# Patient Record
Sex: Male | Born: 1970
Health system: Southern US, Community
[De-identification: ages and names within clinical notes are randomized; demographics above are authoritative.]

## PROBLEM LIST (undated history)

## (undated) DIAGNOSIS — K219 Gastro-esophageal reflux disease without esophagitis: Secondary | ICD-10-CM

## (undated) DIAGNOSIS — S86819A Strain of other muscle(s) and tendon(s) at lower leg level, unspecified leg, initial encounter: Secondary | ICD-10-CM

## (undated) DIAGNOSIS — I10 Essential (primary) hypertension: Secondary | ICD-10-CM

## (undated) DIAGNOSIS — R011 Cardiac murmur, unspecified: Secondary | ICD-10-CM

## (undated) HISTORY — DX: Gastro-esophageal reflux disease without esophagitis: K21.9

## (undated) HISTORY — DX: Cardiac murmur, unspecified: R01.1

## (undated) HISTORY — PX: WISDOM TOOTH EXTRACTION: SHX21

## (undated) HISTORY — PX: KNEE SURGERY: SHX244

---

## 2012-11-04 ENCOUNTER — Emergency Department (INDEPENDENT_AMBULATORY_CARE_PROVIDER_SITE_OTHER)
Admission: EM | Admit: 2012-11-04 | Discharge: 2012-11-04 | Disposition: A | Discharge status: Home / Self Care | Payer: PRIVATE HEALTH INSURANCE | Source: Home / Self Care

## 2012-11-04 ENCOUNTER — Ambulatory Visit (INDEPENDENT_AMBULATORY_CARE_PROVIDER_SITE_OTHER): Payer: PRIVATE HEALTH INSURANCE | Admitting: Sports Medicine

## 2012-11-04 ENCOUNTER — Emergency Department (INDEPENDENT_AMBULATORY_CARE_PROVIDER_SITE_OTHER): Payer: PRIVATE HEALTH INSURANCE

## 2012-11-04 ENCOUNTER — Encounter: Payer: Self-pay | Admitting: *Deleted

## 2012-11-04 DIAGNOSIS — M5137 Other intervertebral disc degeneration, lumbosacral region: Secondary | ICD-10-CM

## 2012-11-04 DIAGNOSIS — M5416 Radiculopathy, lumbar region: Secondary | ICD-10-CM | POA: Insufficient documentation

## 2012-11-04 DIAGNOSIS — M549 Dorsalgia, unspecified: Secondary | ICD-10-CM

## 2012-11-04 DIAGNOSIS — M25559 Pain in unspecified hip: Secondary | ICD-10-CM

## 2012-11-04 DIAGNOSIS — IMO0002 Reserved for concepts with insufficient information to code with codable children: Secondary | ICD-10-CM

## 2012-11-04 HISTORY — DX: Essential (primary) hypertension: I10

## 2012-11-04 MED ORDER — CYCLOBENZAPRINE HCL 10 MG PO TABS: ORAL_TABLET | ORAL | Status: DC

## 2012-11-04 MED ORDER — PREDNISONE 50 MG PO TABS: ORAL_TABLET | ORAL | Status: DC

## 2012-11-04 MED ORDER — KETOROLAC TROMETHAMINE 60 MG/2ML IM SOLN
60.0000 mg | Freq: Once | INTRAMUSCULAR | Status: AC
  Administered 2012-11-04: 60 mg via INTRAMUSCULAR

## 2012-11-04 MED ORDER — MELOXICAM 15 MG PO TABS: ORAL_TABLET | ORAL | Status: DC

## 2012-11-04 NOTE — Progress Notes (Signed)
SPORTS MEDICINE CONSULTATION REPORT  Subjective:    I'm seeing this patient as a consultation for:  Dr. Alvester Morin  CC: Back and leg pain  HPI: This is a very pleasant 41 year old male who unfortunately, has developed pain he localizes in his left buttock traveling down the lateral aspect of his left thigh, left lower leg but not into his foot. He describes the sensation as painful, and like he has a charley horse. He denies burning, numbness, or tingling. The pain is worse with flexion of the spine, worse with long drives in his truck, and worse with coughing, and sneezing. He denies any bowel or bladder incontinence or numbness in the saddle region. He has not yet tried anything to treat this, but he does note that it has been on and off in the past for over 2 years. He does recall increasing his weight, in the gym with a significant amount of squats, and leg presses.  Past medical history, Surgical history, Family history, Social history, Allergies, and medications have been entered into the medical record, reviewed, and no changes needed.   Review of Systems: No headache, visual changes, nausea, vomiting, diarrhea, constipation, dizziness, abdominal pain, skin rash, fevers, chills, night sweats, weight loss, swollen lymph nodes, body aches, joint swelling, muscle aches, chest pain, or shortness of breath.   Objective:   Vitals:  Afebrile, vital signs stable. General: Well Developed, well nourished, and in no acute distress.  Neuro/Psych: Alert and oriented x3, extra-ocular muscles intact, able to move all 4 extremities.  Skin: Warm and dry, no rashes noted.  Respiratory: Not using accessory muscles, speaking in full sentences, trachea midline.  Cardiovascular: Pulses palpable, no extremity edema. Abdomen: Does not appear distended. Back Exam:  Inspection: Unremarkable  Motion: Flexion 45 deg, Extension 45 deg, Side Bending to 45 deg bilaterally,  Rotation to 45 deg bilaterally  SLR laying:  Positive with reproduction of radicular symptoms in an L5 distribution on the left ear XSLR laying: Reproduces pain in the back.  Palpable tenderness: None. FABER: negative. Sensory change: Gross sensation intact to all lumbar and sacral dermatomes.  Reflexes: 2+ at both patellar tendons, 2+ at achilles tendons, Babinski's downgoing.  Strength at left foot  Plantar-flexion: 5/5 Dorsi-flexion: 4/5 Eversion: 5/5 Inversion: 5/5  Leg strength  Quad: 5/5 Hamstring: 5/5 Hip flexor: 5/5 Hip abductors: 5/5  Gait unremarkable.  X-rays show mild degenerative disc disease at the L4-5, and L5-S1 levels.  Impression and Recommendations:   This case required medical decision making of moderate complexity.

## 2012-11-04 NOTE — ED Notes (Signed)
Pt c/o LT hip pain x 1 day, after lifting weights. He took IBF 800mg  today @ 0500.

## 2012-11-04 NOTE — Assessment & Plan Note (Addendum)
Seem to be been present on and off for 2 years. He does have some weakness to dorsiflexion of his left foot. Prednisone, Flexeril at bedtime, Mobic, formal physical therapy. Toradol intramuscular in the urgent care. Return to clinic in 4 weeks to see me.

## 2012-11-05 ENCOUNTER — Telehealth: Payer: Self-pay | Admitting: *Deleted

## 2012-11-05 MED ORDER — TRAMADOL HCL 50 MG PO TABS: 50.0000 mg | ORAL_TABLET | Freq: Three times a day (TID) | ORAL | Status: DC | PRN

## 2012-11-05 NOTE — Telephone Encounter (Signed)
Wife called stating that pt's pain is no better and would like to know what to do. Per Dr. Benjamin Stain we will send Tramadol to pt's pharmacy.

## 2012-12-04 ENCOUNTER — Encounter: Payer: Self-pay | Admitting: Sports Medicine

## 2012-12-04 ENCOUNTER — Ambulatory Visit (INDEPENDENT_AMBULATORY_CARE_PROVIDER_SITE_OTHER): Payer: PRIVATE HEALTH INSURANCE | Admitting: Sports Medicine

## 2012-12-04 VITALS — BP 157/100 | HR 61 | Wt 242.0 lb

## 2012-12-04 DIAGNOSIS — F17200 Nicotine dependence, unspecified, uncomplicated: Secondary | ICD-10-CM

## 2012-12-04 DIAGNOSIS — IMO0002 Reserved for concepts with insufficient information to code with codable children: Secondary | ICD-10-CM

## 2012-12-04 DIAGNOSIS — I10 Essential (primary) hypertension: Secondary | ICD-10-CM | POA: Insufficient documentation

## 2012-12-04 DIAGNOSIS — M5416 Radiculopathy, lumbar region: Secondary | ICD-10-CM

## 2012-12-04 NOTE — Progress Notes (Signed)
SPORTS MEDICINE CONSULTATION REPORT  Subjective:    CC: Lumbar radiculitis  HPI: This is a very pleasant 41 year old male who comes back for followup of low back pain radiating down his leg. To recap, approximately 2 weeks ago he recalls an episode in the gym while pushing on the leg press. He felt a pop, and immediate pain in his low back radiating down his left leg in an L5 distribution. I saw him in the urgent care Center, we gave him Toradol intramuscular, prednisone, muscle relaxers, and anti-inflammatories. X-rays at that time showed L5-S1, as well as L4-L5 mild degenerative disc disease. Since then he is almost completely better. He has been working extensively on the home rehabilitation program, and has noted increase in strength of his core muscles. At this point he continues to improve I would like to avoid advanced imaging until he plateaus.  Smoker: Smoking for a long time. Would like to quit, recently found out his wife is pregnant, and is trying to turn over a new stone.  Hypertension: Is on lisinopril 20 mg daily, but has noted that his blood pressure continues to be high. This has been present for a long time. He also desires to transfer care to [place].  Past medical history, Surgical history, Family history, Social history, Allergies, and medications have been entered into the medical record, reviewed, and no changes needed.   Review of Systems: No headache, visual changes, nausea, vomiting, diarrhea, constipation, dizziness, abdominal pain, skin rash, fevers, chills, night sweats, weight loss, swollen lymph nodes, body aches, joint swelling, muscle aches, chest pain, shortness of breath, mood changes, visual or auditory hallucinations.   Objective:   Vitals:  Afebrile, vital signs stable. General: Well Developed, well nourished, and in no acute distress.  Neuro/Psych: Alert and oriented x3, extra-ocular muscles intact, able to move all 4 extremities.  Skin: Warm and dry, no rashes  noted.  Respiratory: Not using accessory muscles, speaking in full sentences, trachea midline.  Cardiovascular: Pulses palpable, no extremity edema. Abdomen: Does not appear distended. Back Exam:  Inspection: Unremarkable  Motion: Flexion 45 deg, Extension 45 deg, Side Bending to 45 deg bilaterally,  Rotation to 45 deg bilaterally  SLR laying: Negative  XSLR laying: Negative  Palpable tenderness: None. FABER: negative. Sensory change: Gross sensation intact to all lumbar and sacral dermatomes.  Reflexes: 2+ at both patellar tendons, 2+ at achilles tendons, Babinski's downgoing.  Strength at foot  Plantar-flexion: 5/5 Dorsi-flexion: 5/5 Eversion: 5/5 Inversion: 5/5  Leg strength  Quad: 5/5 Hamstring: 5/5 Hip flexor: 5/5 Hip abductors: 5/5  Gait unremarkable.  Impression and Recommendations:   This case required medical decision making of moderate complexity.

## 2012-12-04 NOTE — Assessment & Plan Note (Signed)
Was completely improved. He will continue his home rehabilitation exercises, and I will see him back in one month. If he plateaus in improvement, will consider MRI for interventional injection planning.

## 2012-12-04 NOTE — Assessment & Plan Note (Signed)
Only on lisinopril. I would like him to avoid all salt. He will come back and see me and we will further augment his medications. He does desire to switch care to [place].

## 2012-12-04 NOTE — Assessment & Plan Note (Signed)
Recently found out that his wife is pregnant. This will be his first child, and he is very motivated to quit smoking now. We will discuss strategies at his next visit, I do suspect we should try Chantix.

## 2013-01-02 ENCOUNTER — Ambulatory Visit (INDEPENDENT_AMBULATORY_CARE_PROVIDER_SITE_OTHER): Payer: PRIVATE HEALTH INSURANCE | Admitting: Sports Medicine

## 2013-01-02 ENCOUNTER — Encounter: Payer: Self-pay | Admitting: Sports Medicine

## 2013-01-02 VITALS — BP 141/87 | HR 75 | Wt 245.0 lb

## 2013-01-02 DIAGNOSIS — IMO0002 Reserved for concepts with insufficient information to code with codable children: Secondary | ICD-10-CM

## 2013-01-02 DIAGNOSIS — I10 Essential (primary) hypertension: Secondary | ICD-10-CM

## 2013-01-02 DIAGNOSIS — M5416 Radiculopathy, lumbar region: Secondary | ICD-10-CM

## 2013-01-02 MED ORDER — LISINOPRIL 40 MG PO TABS: 40.0000 mg | ORAL_TABLET | Freq: Every day | ORAL | Status: DC

## 2013-01-02 NOTE — Assessment & Plan Note (Signed)
Increasing lisinopril to 40mg .

## 2013-01-02 NOTE — Assessment & Plan Note (Signed)
Back pain is completely resolved, he still has some weakness in his leg, predominantly left foot dorsiflexion. We are going to obtain an MRI at this point for interventional injection planning. Hopefully we can get some of his strength back, otherwise he would certainly need microdiscectomy.

## 2013-01-02 NOTE — Progress Notes (Signed)
Subjective:    CC: Followup  HPI: Left L5 radiculitis: 4 weeks later, back pain is completely resolved with rehabilitation, prednisone, anti-inflammatories, and cyclobenzaprine. Unfortunately he still has some weakness in his left leg predominately with knee extension, hip flexion, and ankle dorsiflexion.  Symptoms are moderate, there is no further radiating pain.  Hypertension: Improved significantly with lisinopril 20.  Past medical history, Surgical history, Family history not pertinant except as noted below, Social history, Allergies, and medications have been entered into the medical record, reviewed, and no changes needed.   Review of Systems: No fevers, chills, night sweats, weight loss, chest pain, or shortness of breath.   Objective:    General: Well Developed, well nourished, and in no acute distress.  Neuro: Alert and oriented x3, extra-ocular muscles intact, sensation grossly intact.  HEENT: Normocephalic, atraumatic, pupils equal round reactive to light, neck supple, no masses, no lymphadenopathy, thyroid nonpalpable.  Skin: Warm and dry, no rashes. Cardiac: Regular rate and rhythm, no murmurs rubs or gallops.  Respiratory: Clear to auscultation bilaterally. Not using accessory muscles, speaking in full sentences. Weak ankle dorsiflexion on the left side.  Impression and Recommendations:

## 2013-01-16 ENCOUNTER — Ambulatory Visit (INDEPENDENT_AMBULATORY_CARE_PROVIDER_SITE_OTHER): Payer: PRIVATE HEALTH INSURANCE | Admitting: Sports Medicine

## 2013-01-16 ENCOUNTER — Encounter: Payer: Self-pay | Admitting: Sports Medicine

## 2013-01-16 VITALS — BP 159/97 | HR 74 | Wt 246.0 lb

## 2013-01-16 DIAGNOSIS — L02412 Cutaneous abscess of left axilla: Secondary | ICD-10-CM | POA: Insufficient documentation

## 2013-01-16 DIAGNOSIS — IMO0002 Reserved for concepts with insufficient information to code with codable children: Secondary | ICD-10-CM

## 2013-01-16 MED ORDER — HYDROCODONE-ACETAMINOPHEN 10-325 MG PO TABS: 1.0000 | ORAL_TABLET | Freq: Three times a day (TID) | ORAL | Status: DC | PRN

## 2013-01-16 MED ORDER — DOXYCYCLINE HYCLATE 100 MG PO TABS: 100.0000 mg | ORAL_TABLET | Freq: Two times a day (BID) | ORAL | Status: AC

## 2013-01-16 NOTE — Progress Notes (Signed)
Subjective:    CC: Boil  HPI: Nicholas Pearson is a very pleasant 42 year old male who I have been following for lumbar radiculitis.  Unfortunately he comes in with pain he localizes in the left armpit. He said abscesses before, and this is one of the largest. Pain is localized, doesn't radiate, it is severe.  Past medical history, Surgical history, Family history not pertinant except as noted below, Social history, Allergies, and medications have been entered into the medical record, reviewed, and no changes needed.   Review of Systems: No fevers, chills, night sweats, weight loss, chest pain, or shortness of breath.   Objective:    General: Well Developed, well nourished, and in no acute distress.  Neuro: Alert and oriented x3, extra-ocular muscles intact, sensation grossly intact.  HEENT: Normocephalic, atraumatic, pupils equal round reactive to light, neck supple, no masses, no lymphadenopathy, thyroid nonpalpable.  Skin: Warm and dry, no rashes. There is a large abscess approximately 3-4 cm across in the left axilla. Cardiac: Regular rate and rhythm, no murmurs rubs or gallops.  Respiratory: Clear to auscultation bilaterally. Not using accessory muscles, speaking in full sentences.  Procedure:  Complicated incision and drainage of abscess.  Risks, benefits, and alternatives explained and consent obtained. Time out conducted. Surface cleaned with alcohol. 10cc lidocaine with epinephine infiltrated around abscess. Adequate anesthesia ensured. Area prepped and draped in a sterile fashion. #11 blade used to make a stab incision into abscess. Pus expressed with pressure. Curved hemostat used to explore 4 quadrants and loculations broken up. Further purulence expressed. Iodoform packing placed leaving a 1-inch tail. Hemostasis achieved. Pt stable. Aftercare and follow-up advised. Impression and Recommendations:

## 2013-01-16 NOTE — Patient Instructions (Addendum)
Incision and Drainage Care After Refer to this sheet in the next few weeks. These instructions provide you with information on caring for yourself after your procedure. Your caregiver may also give you more specific instructions. Your treatment has been planned according to current medical practices, but problems sometimes occur. Call your caregiver if you have any problems or questions after your procedure. HOME CARE INSTRUCTIONS    If antibiotic medicine is given, take it as directed. Finish it even if you start to feel better.   Only take over-the-counter or prescription medicines for pain, discomfort, or fever as directed by your caregiver.   Keep all follow-up appointments as directed by your caregiver.   Change any bandages (dressings) as directed by your caregiver. Replace old dressings with clean dressings.   Wash your hands before and after caring for your wound.  You will receive specific instructions for cleansing and caring for your wound.   SEEK MEDICAL CARE IF:    You have increased pain, swelling, or redness around the wound.   You have increased drainage, smell, or bleeding from the wound.   You have muscle aches, chills, or you feel generally sick.   You have a fever.  MAKE SURE YOU:    Understand these instructions.   Will watch your condition.   Will get help right away if you are not doing well or get worse.  Document Released: 02/25/2012 Document Reviewed: 02/25/2012 [employer] Patient Information 2013 [employer].    

## 2013-01-16 NOTE — Assessment & Plan Note (Signed)
Large, complicated drainage with approximately 1 foot of iodoform packing. Doxycycline for 14 days, culture, Vicodin. He will remove half of the packing after a couple of days, and then come to see me in a week for a wound check.

## 2013-01-19 ENCOUNTER — Encounter: Payer: Self-pay | Admitting: Sports Medicine

## 2013-01-19 ENCOUNTER — Telehealth: Payer: Self-pay | Admitting: *Deleted

## 2013-01-19 ENCOUNTER — Ambulatory Visit (INDEPENDENT_AMBULATORY_CARE_PROVIDER_SITE_OTHER): Payer: PRIVATE HEALTH INSURANCE | Admitting: Sports Medicine

## 2013-01-19 VITALS — BP 163/103 | HR 75

## 2013-01-19 DIAGNOSIS — IMO0002 Reserved for concepts with insufficient information to code with codable children: Secondary | ICD-10-CM

## 2013-01-19 DIAGNOSIS — L02412 Cutaneous abscess of left axilla: Secondary | ICD-10-CM

## 2013-01-19 DIAGNOSIS — M5416 Radiculopathy, lumbar region: Secondary | ICD-10-CM

## 2013-01-19 LAB — WOUND CULTURE
Culture: NO GROWTH
Gram Stain: NONE SEEN
Gram Stain: NONE SEEN
Gram Stain: NONE SEEN
Organism ID, Bacteria: NO GROWTH

## 2013-01-19 NOTE — Telephone Encounter (Signed)
Lanced abcess under arm Friday. Pulled some packing out yesterday and today when got up the piece that hanging off ffrom where he cut it the day before is not there and packing is down in the incision and he can not see it. Please advise. Has appt here friday

## 2013-01-19 NOTE — Telephone Encounter (Signed)
Pt on his way now.

## 2013-01-19 NOTE — Assessment & Plan Note (Signed)
Symptoms continue to improve. Still has MRI scheduled. Will follow this up after MRI.

## 2013-01-19 NOTE — Telephone Encounter (Signed)
No worries, have him come see me either today or tomorrow, I can work him in anytime so I can just fish out the piece of packing material.

## 2013-01-19 NOTE — Assessment & Plan Note (Signed)
It seems as though Nicholas Pearson did remove all of the packing material. I explored the wound extensively, and no further packing material was found.  No further purulence was able to be expressed either.

## 2013-01-19 NOTE — Progress Notes (Signed)
  Subjective:    CC: Followup  HPI: Left axillary abscess: I performed an I&D last week, cultures have been negative, he is currently on doxycycline. Recently, he was pulling out some of the packing, and thought that he cut off a one-inch tail. Unfortunately, the next time he looked he was unable to see any packing and was worried that the wound closed in over top of the packing material. Overall pain is improving, and he denies any constitutional symptoms.  Left lumbar radiculitis: Left L5, overall continuing to improve, but does have an MRI coming up. Pain was localized in the back, radiating down the left leg in an L5 distribution, was moderate.  Past medical history, Surgical history, Family history not pertinant except as noted below, Social history, Allergies, and medications have been entered into the medical record, reviewed, and no changes needed.   Review of Systems: No headache, visual changes, nausea, vomiting, diarrhea, constipation, dizziness, abdominal pain, skin rash, fevers, chills, night sweats, weight loss, swollen lymph nodes, body aches, joint swelling, muscle aches, chest pain, shortness of breath, mood changes, visual or auditory hallucinations.   Objective:   General: Well Developed, well nourished, and in no acute distress.  Neuro/Psych: Alert and oriented x3, extra-ocular muscles intact, able to move all 4 extremities, sensation grossly intact. Skin: Warm and dry, no rashes noted.  Respiratory: Not using accessory muscles, speaking in full sentences, trachea midline.  Cardiovascular: Pulses palpable, no extremity edema. Abdomen: Does not appear distended.  Procedure:  Exploration of previous I&D wound.  Risks, benefits, and alternatives explained and consent obtained. Time out conducted. Surface cleaned with alcohol. 10cc lidocaine with epinephine infiltrated around abscess. Adequate anesthesia ensured. Area prepped and draped in a sterile fashion. I used the  hemostat to explore several quadrants of the wound, no further purulence was able to be expressed, and no packing material was left in the wound after extensive exploration. Hemostasis achieved. Pt stable. Aftercare and follow-up advised.  Impression and Recommendations:   This case required medical decision making of moderate complexity.

## 2013-01-21 LAB — ANAEROBIC CULTURE
Gram Stain: NONE SEEN
Gram Stain: NONE SEEN
Gram Stain: NONE SEEN

## 2013-01-23 ENCOUNTER — Ambulatory Visit (INDEPENDENT_AMBULATORY_CARE_PROVIDER_SITE_OTHER): Payer: PRIVATE HEALTH INSURANCE | Admitting: Sports Medicine

## 2013-01-23 ENCOUNTER — Encounter: Payer: Self-pay | Admitting: Sports Medicine

## 2013-01-23 VITALS — BP 152/95 | HR 71 | Temp 97.5°F

## 2013-01-23 DIAGNOSIS — G4733 Obstructive sleep apnea (adult) (pediatric): Secondary | ICD-10-CM | POA: Insufficient documentation

## 2013-01-23 DIAGNOSIS — L02412 Cutaneous abscess of left axilla: Secondary | ICD-10-CM

## 2013-01-23 DIAGNOSIS — G4730 Sleep apnea, unspecified: Secondary | ICD-10-CM

## 2013-01-23 DIAGNOSIS — M5416 Radiculopathy, lumbar region: Secondary | ICD-10-CM

## 2013-01-23 DIAGNOSIS — IMO0002 Reserved for concepts with insufficient information to code with codable children: Secondary | ICD-10-CM

## 2013-01-23 NOTE — Assessment & Plan Note (Signed)
Nicholas Pearson has his MRI coming up. I will see him back to go over MRI results. We will do this for interventional injection planning.

## 2013-01-23 NOTE — Assessment & Plan Note (Signed)
With hypertension, increasing snoring, excessive daytime sleepiness, we will get a sleep study with CPAP titration. If negative, we can certainly consider treating him for acid reflux.

## 2013-01-23 NOTE — Assessment & Plan Note (Signed)
Healing well. Tegaderm applied. Return as needed.

## 2013-01-23 NOTE — Progress Notes (Signed)
Subjective:    CC: Followup  HPI: Left axillary abscess: Status post incision and drainage, overall doing better. He thought that he had lost some of the packing in the wound, I explored the wound extensively at the last visit, found no packing, so we can assume he pulled it all out by accident.  Sleep apnea: As tested in the past but negative, but now notes increasing daytime sleepiness, snoring, and globus sensation. Desiring additional sleep study.  Lumbar radiculitis: Improved, but still not perfect. He is awaiting his MRI which is coming up soon.  Past medical history, Surgical history, Family history not pertinant except as noted below, Social history, Allergies, and medications have been entered into the medical record, reviewed, and no changes needed.   Review of Systems: No fevers, chills, night sweats, weight loss, chest pain, or shortness of breath.   Objective:    General: Well Developed, well nourished, and in no acute distress.  Neuro: Alert and oriented x3, extra-ocular muscles intact, sensation grossly intact.  HEENT: Normocephalic, atraumatic, pupils equal round reactive to light, neck supple, no masses, no lymphadenopathy, thyroid nonpalpable.  Skin: Warm and dry, no rashes. Left axilla shows a healing nodule, it is nontender to palpation without erythema, or induration. Cardiac: Regular rate and rhythm, no murmurs rubs or gallops.  Respiratory: Clear to auscultation bilaterally. Not using accessory muscles, speaking in full sentences. Impression and Recommendations:

## 2013-01-24 ENCOUNTER — Ambulatory Visit (HOSPITAL_BASED_OUTPATIENT_CLINIC_OR_DEPARTMENT_OTHER)
Admission: RE | Admit: 2013-01-24 | Discharge: 2013-01-24 | Disposition: A | Discharge status: Home / Self Care | Payer: PRIVATE HEALTH INSURANCE | Source: Ambulatory Visit | Attending: Sports Medicine | Admitting: Sports Medicine

## 2013-01-24 DIAGNOSIS — M545 Low back pain, unspecified: Secondary | ICD-10-CM | POA: Insufficient documentation

## 2013-01-24 DIAGNOSIS — M79609 Pain in unspecified limb: Secondary | ICD-10-CM | POA: Insufficient documentation

## 2013-01-24 DIAGNOSIS — M5126 Other intervertebral disc displacement, lumbar region: Secondary | ICD-10-CM | POA: Insufficient documentation

## 2013-01-24 DIAGNOSIS — M5416 Radiculopathy, lumbar region: Secondary | ICD-10-CM

## 2013-01-24 DIAGNOSIS — Z87828 Personal history of other (healed) physical injury and trauma: Secondary | ICD-10-CM | POA: Insufficient documentation

## 2013-01-30 ENCOUNTER — Ambulatory Visit: Payer: PRIVATE HEALTH INSURANCE | Admitting: Sports Medicine

## 2013-02-03 ENCOUNTER — Ambulatory Visit (INDEPENDENT_AMBULATORY_CARE_PROVIDER_SITE_OTHER): Payer: PRIVATE HEALTH INSURANCE | Admitting: Sports Medicine

## 2013-02-03 DIAGNOSIS — IMO0002 Reserved for concepts with insufficient information to code with codable children: Secondary | ICD-10-CM

## 2013-02-03 DIAGNOSIS — M5416 Radiculopathy, lumbar region: Secondary | ICD-10-CM

## 2013-02-03 NOTE — Progress Notes (Signed)
  Subjective:    CC: MRI results  HPI: Left lumbar radiculitis: Nicholas Pearson is a very pleasant 42 year old male who's had lumbar radiculitis for some time now. He has been to formal physical therapy, oral steroids, muscle relaxers, NSAIDs, he's noted excellent benefit and is significantly better than when the symptoms started but unfortunately he still gets occasional pain down the posterior lateral aspect of his left thigh, anterior aspect of his left lower leg. He also notes some weakness to dorsiflexion and plantar flexion of his left foot. Symptoms are moderate, stable.  Status post left axillary abscess incision and drainage: He would like me to look at the wound.  Past medical history, Surgical history, Family history not pertinant except as noted below, Social history, Allergies, and medications have been entered into the medical record, reviewed, and no changes needed.   Review of Systems: No headache, visual changes, nausea, vomiting, diarrhea, constipation, dizziness, abdominal pain, skin rash, fevers, chills, night sweats, weight loss, swollen lymph nodes, body aches, joint swelling, muscle aches, chest pain, shortness of breath, mood changes, visual or auditory hallucinations.   Objective:   General: Well Developed, well nourished, and in no acute distress.  Neuro/Psych: Alert and oriented x3, extra-ocular muscles intact, able to move all 4 extremities, sensation grossly intact. Skin: Warm and dry, no rashes noted. Previous incision and drainage site appears well-healed, is no longer any warmth or fluctuance, small nodularities palpable which is not unexpected. Respiratory: Not using accessory muscles, speaking in full sentences, trachea midline.  Cardiovascular: Pulses palpable, no extremity edema. Abdomen: Does not appear distended.   Impression and Recommendations:   This case required medical decision making of moderate complexity.

## 2013-02-03 NOTE — Assessment & Plan Note (Signed)
MRI confirms multilevel degenerative disc disease, worse at the L4-L5 level with bilateral foraminal stenosis as well as at the L5-S1 level with predominantly left-sided foraminal stenosis. He has failed conservative therapy I think the next that the next step should be a left-sided selective L5 nerve root block. I would like to see him back after the injection.

## 2013-02-20 ENCOUNTER — Other Ambulatory Visit: Payer: PRIVATE HEALTH INSURANCE

## 2013-02-27 ENCOUNTER — Other Ambulatory Visit: Payer: PRIVATE HEALTH INSURANCE

## 2013-02-27 ENCOUNTER — Ambulatory Visit
Admission: RE | Admit: 2013-02-27 | Discharge: 2013-02-27 | Disposition: A | Discharge status: Home / Self Care | Payer: No Typology Code available for payment source | Source: Ambulatory Visit | Attending: Sports Medicine | Admitting: Sports Medicine

## 2013-02-27 DIAGNOSIS — M5416 Radiculopathy, lumbar region: Secondary | ICD-10-CM

## 2013-02-27 MED ORDER — METHYLPREDNISOLONE ACETATE 40 MG/ML INJ SUSP (RADIOLOG
120.0000 mg | Freq: Once | INTRAMUSCULAR | Status: AC
  Administered 2013-02-27: 120 mg via EPIDURAL

## 2013-02-27 MED ORDER — IOHEXOL 180 MG/ML  SOLN
1.0000 mL | Freq: Once | INTRAMUSCULAR | Status: AC | PRN
  Administered 2013-02-27: 1 mL via EPIDURAL

## 2013-03-15 ENCOUNTER — Other Ambulatory Visit: Payer: Self-pay | Admitting: Sports Medicine

## 2013-04-08 ENCOUNTER — Telehealth: Payer: Self-pay | Admitting: Sports Medicine

## 2013-04-08 MED ORDER — OMEPRAZOLE 40 MG PO CPDR: 40.0000 mg | DELAYED_RELEASE_CAPSULE | Freq: Every day | ORAL | Status: DC

## 2013-04-08 NOTE — Telephone Encounter (Signed)
Please shoot Nicholas Pearson a call, sleep study was negative for sleep apnea.  I'm going to add a medication for acid reflux.

## 2013-04-08 NOTE — Telephone Encounter (Signed)
He has scheduled an appt b/c he has to f/u with you since he had the injection. I will call him and him your message. Pt informed of your message and states he will see [place] on Friday.

## 2013-04-10 ENCOUNTER — Ambulatory Visit (INDEPENDENT_AMBULATORY_CARE_PROVIDER_SITE_OTHER): Payer: PRIVATE HEALTH INSURANCE | Admitting: Sports Medicine

## 2013-04-10 ENCOUNTER — Encounter: Payer: Self-pay | Admitting: Sports Medicine

## 2013-04-10 VITALS — BP 154/100 | HR 70

## 2013-04-10 DIAGNOSIS — IMO0002 Reserved for concepts with insufficient information to code with codable children: Secondary | ICD-10-CM

## 2013-04-10 DIAGNOSIS — M5416 Radiculopathy, lumbar region: Secondary | ICD-10-CM

## 2013-04-10 DIAGNOSIS — I10 Essential (primary) hypertension: Secondary | ICD-10-CM

## 2013-04-10 DIAGNOSIS — K219 Gastro-esophageal reflux disease without esophagitis: Secondary | ICD-10-CM

## 2013-04-10 DIAGNOSIS — J387 Other diseases of larynx: Secondary | ICD-10-CM

## 2013-04-10 MED ORDER — LISINOPRIL-HYDROCHLOROTHIAZIDE 20-25 MG PO TABS: 1.0000 | ORAL_TABLET | Freq: Every day | ORAL | Status: DC

## 2013-04-10 NOTE — Progress Notes (Signed)
  Subjective:    CC: Followup  HPI: Left lumbar radiculitis: Two-level degenerative disc disease, with bilateral foraminal stenosis at the L5-S1 level, and left-sided foraminal stenosis at the L4-L5 level. He is recently status post left-sided L5-S1 transforaminal injection which only provided minimal benefit, and at no point did he have complete relief of his pain.  Laryngopharyngeal reflux: Is just now starting Prilosec. Sleep study was negative.  Hypertension: Improved but not ideal, no issues with his medication.  Smoker: Is interested in trying a vapor cigarette, does not want to do Chantix.  Past medical history, Surgical history, Family history not pertinant except as noted below, Social history, Allergies, and medications have been entered into the medical record, reviewed, and no changes needed.   Review of Systems: No fevers, chills, night sweats, weight loss, chest pain, or shortness of breath.   Objective:    General: Well Developed, well nourished, and in no acute distress.  Neuro: Alert and oriented x3, extra-ocular muscles intact, sensation grossly intact.  HEENT: Normocephalic, atraumatic, pupils equal round reactive to light, neck supple, no masses, no lymphadenopathy, thyroid nonpalpable.  Skin: Warm and dry, no rashes. Cardiac: Regular rate and rhythm, no murmurs rubs or gallops, no lower extremity edema.  Respiratory: Clear to auscultation bilaterally. Not using accessory muscles, speaking in full sentences. Impression and Recommendations:

## 2013-04-10 NOTE — Assessment & Plan Note (Signed)
With a negative sleep study we will pursue reflux. Prilosec 40 at bedtime.

## 2013-04-10 NOTE — Assessment & Plan Note (Signed)
Increasing to lisinopril/hydrochlorothiazide.

## 2013-04-10 NOTE — Assessment & Plan Note (Signed)
Previous injections at the L5-S1 level level, targeting the L5 nerve root. Improvement was minimal. At this point I think we should try the L4-L5 level with a selective transforaminal injection.

## 2013-04-12 ENCOUNTER — Other Ambulatory Visit: Payer: Self-pay | Admitting: Sports Medicine

## 2013-05-10 ENCOUNTER — Other Ambulatory Visit: Payer: Self-pay | Admitting: Sports Medicine

## 2013-05-25 ENCOUNTER — Ambulatory Visit (INDEPENDENT_AMBULATORY_CARE_PROVIDER_SITE_OTHER): Payer: PRIVATE HEALTH INSURANCE | Admitting: Family Medicine

## 2013-05-25 ENCOUNTER — Encounter: Payer: Self-pay | Admitting: Family Medicine

## 2013-05-25 VITALS — BP 125/74 | HR 78 | Wt 245.0 lb

## 2013-05-25 DIAGNOSIS — K625 Hemorrhage of anus and rectum: Secondary | ICD-10-CM

## 2013-05-25 NOTE — Progress Notes (Signed)
CC: Nicholas Pearson is a 42 y.o. male is here for Blood in stool   Subjective: HPI:  Patient complains of diarrhea that occurred on Saturday 2 episodes of watery diarrhea within 24 hours, second episode had bright red blood in the toilet bowl. Since then he is back to daily bowel movements that are painless without tar-like appearance and without blood. He denies any recent or remote abdominal or pelvic pain. Prior to diarrhea he was having a daily bowel movement at the same time daily which was painless and did not require any straining.  He states he's feeling great today. He denies any back pain, nausea, lack of appetite fevers, chills, nor history of bleeding issues. He denies any rectal discomfort. He believes there is no family history of colon cancer   Review Of Systems Outlined In HPI  Past Medical History  Diagnosis Date  . Hypertension      No family history on file.   History  Substance Use Topics  . Smoking status: Current Some Day Smoker    Types: Cigars  . Smokeless tobacco: Not on file  . Alcohol Use: Yes     Comment: 2 cigars per day.     Objective: Filed Vitals:   05/25/13 1008  BP: 125/74  Pulse: 78    General: Alert and Oriented, No Acute Distress HEENT: Pupils equal, round, reactive to light. Conjunctivae clear.  Lungs: Clear comfortable work of breathing Cardiac: Regular rate and rhythm. Normal S1/S2.  No murmurs, rubs, nor gallops.   Abdomen: Normal bowel sounds, soft and non tender without palpable masses. Rectal: Normal external sphincter tone, scarring present from result external hemorrhoids no evidence of fissure. Extremities: No peripheral edema.  Strong peripheral pulses.  Mental Status: No depression, anxiety, nor agitation. Skin: Warm and dry.  Assessment & Plan: Jerran was seen today for blood in stool.  Diagnoses and associated orders for this visit:  Rectal bleeding    Discussed with patient that most likely he had a internal  hemorrhoid that bled due to his diarrhea episode. Since he believes he has stopped bleeding I would like him to take stool cards home if there is evidence of blood Will refer for colonoscopy/sigmoidoscopy. Given that he is back to painless easy to pass bowel movements on a daily basis we'll hold off on altering fiber intake  Return if symptoms worsen or fail to improve.

## 2013-05-29 ENCOUNTER — Other Ambulatory Visit: Payer: PRIVATE HEALTH INSURANCE

## 2014-03-29 ENCOUNTER — Other Ambulatory Visit: Payer: Self-pay | Admitting: Sports Medicine

## 2014-05-02 ENCOUNTER — Other Ambulatory Visit: Payer: Self-pay | Admitting: Sports Medicine

## 2014-05-04 ENCOUNTER — Telehealth: Payer: Self-pay | Admitting: Sports Medicine

## 2014-05-04 NOTE — Telephone Encounter (Signed)
Which med are we talking about?

## 2014-05-04 NOTE — Telephone Encounter (Signed)
Patien's wife made pt an appointment for 05/14/14 for med refill but he needs a little refill until his appt because he is completely out. Thanks

## 2014-05-07 MED ORDER — LISINOPRIL-HYDROCHLOROTHIAZIDE 20-25 MG PO TABS: 1.0000 | ORAL_TABLET | Freq: Every day | ORAL | Status: DC

## 2014-05-07 NOTE — Telephone Encounter (Signed)
Done

## 2014-05-07 NOTE — Telephone Encounter (Signed)
His blood pressure medicine. 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 Thanks sorry didn't say what med-

## 2014-05-14 ENCOUNTER — Ambulatory Visit: Payer: PRIVATE HEALTH INSURANCE | Admitting: Sports Medicine

## 2014-05-17 ENCOUNTER — Ambulatory Visit (INDEPENDENT_AMBULATORY_CARE_PROVIDER_SITE_OTHER): Payer: BC Managed Care – PPO | Admitting: Sports Medicine

## 2014-05-17 ENCOUNTER — Encounter: Payer: Self-pay | Admitting: Sports Medicine

## 2014-05-17 VITALS — BP 122/78 | HR 69 | Wt 245.0 lb

## 2014-05-17 DIAGNOSIS — Z299 Encounter for prophylactic measures, unspecified: Secondary | ICD-10-CM

## 2014-05-17 DIAGNOSIS — Z0000 Encounter for general adult medical examination without abnormal findings: Secondary | ICD-10-CM | POA: Insufficient documentation

## 2014-05-17 DIAGNOSIS — K625 Hemorrhage of anus and rectum: Secondary | ICD-10-CM

## 2014-05-17 DIAGNOSIS — I10 Essential (primary) hypertension: Secondary | ICD-10-CM

## 2014-05-17 DIAGNOSIS — IMO0002 Reserved for concepts with insufficient information to code with codable children: Secondary | ICD-10-CM | POA: Diagnosis not present

## 2014-05-17 DIAGNOSIS — M5416 Radiculopathy, lumbar region: Secondary | ICD-10-CM

## 2014-05-17 LAB — COMPREHENSIVE METABOLIC PANEL
ALT: 39 U/L (ref 0–53)
AST: 36 U/L (ref 0–37)
Alkaline Phosphatase: 49 U/L (ref 39–117)
Creat: 1.14 mg/dL (ref 0.50–1.35)
Sodium: 137 mEq/L (ref 135–145)
Total Bilirubin: 0.4 mg/dL (ref 0.2–1.2)
Total Protein: 7.5 g/dL (ref 6.0–8.3)

## 2014-05-17 LAB — COMPREHENSIVE METABOLIC PANEL WITH GFR
Albumin: 4.6 g/dL (ref 3.5–5.2)
BUN: 12 mg/dL (ref 6–23)
CO2: 25 meq/L (ref 19–32)
Calcium: 9.7 mg/dL (ref 8.4–10.5)
Chloride: 102 meq/L (ref 96–112)
Glucose, Bld: 97 mg/dL (ref 70–99)
Potassium: 3.8 meq/L (ref 3.5–5.3)

## 2014-05-17 LAB — CBC
HCT: 43.8 % (ref 39.0–52.0)
Hemoglobin: 15.4 g/dL (ref 13.0–17.0)
MCH: 29.8 pg (ref 26.0–34.0)
MCHC: 35.2 g/dL (ref 30.0–36.0)
MCV: 84.9 fL (ref 78.0–100.0)
Platelets: 206 10*3/uL (ref 150–400)
RBC: 5.16 MIL/uL (ref 4.22–5.81)
RDW: 14 % (ref 11.5–15.5)
WBC: 4.3 10*3/uL (ref 4.0–10.5)

## 2014-05-17 LAB — LIPID PANEL
Cholesterol: 207 mg/dL — ABNORMAL HIGH (ref 0–200)
HDL: 40 mg/dL (ref >39)
LDL Cholesterol: 107 mg/dL — ABNORMAL HIGH (ref 0–99)
Total CHOL/HDL Ratio: 5.2 Ratio
Triglycerides: 300 mg/dL — ABNORMAL HIGH (ref <150)
VLDL: 60 mg/dL — ABNORMAL HIGH (ref 0–40)

## 2014-05-17 LAB — HEMOGLOBIN A1C
Hgb A1c MFr Bld: 5.8 % — ABNORMAL HIGH (ref <5.7)
Mean Plasma Glucose: 120 mg/dL — ABNORMAL HIGH (ref <117)

## 2014-05-17 LAB — TSH: TSH: 2.117 u[IU]/mL (ref 0.350–4.500)

## 2014-05-17 MED ORDER — OMEPRAZOLE 40 MG PO CPDR: 40.0000 mg | DELAYED_RELEASE_CAPSULE | Freq: Every day | ORAL | Status: DC

## 2014-05-17 MED ORDER — LISINOPRIL-HYDROCHLOROTHIAZIDE 20-25 MG PO TABS: 1.0000 | ORAL_TABLET | Freq: Every day | ORAL | Status: DC

## 2014-05-17 NOTE — Progress Notes (Signed)
  Subjective:    CC: Followup  HPI: Hypertension: Well controlled.  Acid reflux: Well controlled.  Left L5 radiculopathy : improved after his most recent epidural by Dr. Tessa Lerner.  Rectal bleeding: Resolved.  Past medical history, Surgical history, Family history not pertinant except as noted below, Social history, Allergies, and medications have been entered into the medical record, reviewed, and no changes needed.   Review of Systems: No fevers, chills, night sweats, weight loss, chest pain, or shortness of breath.   Objective:    General: Well Developed, well nourished, and in no acute distress.  Neuro: Alert and oriented x3, extra-ocular muscles intact, sensation grossly intact.  HEENT: Normocephalic, atraumatic, pupils equal round reactive to light, neck supple, no masses, no lymphadenopathy, thyroid nonpalpable.  Skin: Warm and dry, no rashes. Cardiac: Regular rate and rhythm, no murmurs rubs or gallops, no lower extremity edema.  Respiratory: Clear to auscultation bilaterally. Not using accessory muscles, speaking in full sentences.  Impression and Recommendations:

## 2014-05-17 NOTE — Assessment & Plan Note (Signed)
Up-to-date on all preventive measures, did have some rectal bleeding which has stopped. We are checking a CBC to ensure no subclinical blood loss/anemia.

## 2014-05-17 NOTE — Assessment & Plan Note (Signed)
Well-controlled on lisinopril/hydrochlorothiazide. Checking blood work.

## 2014-05-17 NOTE — Assessment & Plan Note (Signed)
Good response to his last lumbar epidural. He will set up another one himself.

## 2014-05-18 ENCOUNTER — Encounter: Payer: Self-pay | Admitting: Sports Medicine

## 2014-05-18 DIAGNOSIS — R7303 Prediabetes: Secondary | ICD-10-CM | POA: Insufficient documentation

## 2014-05-18 DIAGNOSIS — E781 Pure hyperglyceridemia: Secondary | ICD-10-CM | POA: Insufficient documentation

## 2014-11-10 ENCOUNTER — Other Ambulatory Visit: Payer: Self-pay | Admitting: Sports Medicine

## 2014-12-03 ENCOUNTER — Other Ambulatory Visit: Payer: Self-pay | Admitting: *Deleted

## 2014-12-03 MED ORDER — LISINOPRIL-HYDROCHLOROTHIAZIDE 20-25 MG PO TABS: 1.0000 | ORAL_TABLET | Freq: Every day | ORAL | Status: DC

## 2014-12-03 NOTE — Progress Notes (Signed)
[facility] called for refills and they were sent to pharmacy and he was then transferred to front to schedule follow up appt.

## 2014-12-24 ENCOUNTER — Ambulatory Visit: Payer: BC Managed Care – PPO | Admitting: Sports Medicine

## 2014-12-31 ENCOUNTER — Ambulatory Visit: Payer: Self-pay | Admitting: Sports Medicine

## 2014-12-31 DIAGNOSIS — Z0289 Encounter for other administrative examinations: Secondary | ICD-10-CM

## 2015-01-19 ENCOUNTER — Encounter: Payer: Self-pay | Admitting: Sports Medicine

## 2015-01-19 ENCOUNTER — Ambulatory Visit (INDEPENDENT_AMBULATORY_CARE_PROVIDER_SITE_OTHER): Payer: BLUE CROSS/BLUE SHIELD | Admitting: Sports Medicine

## 2015-01-19 VITALS — BP 115/72 | HR 72 | Wt 245.0 lb

## 2015-01-19 DIAGNOSIS — J387 Other diseases of larynx: Secondary | ICD-10-CM | POA: Diagnosis not present

## 2015-01-19 DIAGNOSIS — E781 Pure hyperglyceridemia: Secondary | ICD-10-CM

## 2015-01-19 DIAGNOSIS — K219 Gastro-esophageal reflux disease without esophagitis: Secondary | ICD-10-CM

## 2015-01-19 DIAGNOSIS — I10 Essential (primary) hypertension: Secondary | ICD-10-CM | POA: Diagnosis not present

## 2015-01-19 MED ORDER — NIACIN ER (ANTIHYPERLIPIDEMIC) 1000 MG PO TBCR: 1000.0000 mg | EXTENDED_RELEASE_TABLET | Freq: Every day | ORAL | Status: DC

## 2015-01-19 MED ORDER — LISINOPRIL-HYDROCHLOROTHIAZIDE 20-25 MG PO TABS: 1.0000 | ORAL_TABLET | Freq: Every day | ORAL | Status: DC

## 2015-01-19 NOTE — Assessment & Plan Note (Signed)
Adding niacin. Recheck in 3 months.

## 2015-01-19 NOTE — Assessment & Plan Note (Signed)
Well controlled with omeprazole.

## 2015-01-19 NOTE — Assessment & Plan Note (Signed)
Well controlled, no changes 

## 2015-01-19 NOTE — Progress Notes (Signed)
  Subjective:    CC: follow-up  HPI: Hypertension: Well controlled, needs refill on medication.  Hyperlipidemia: Predominantly hypertriglyceridemia, amenable to start medication.  Laryngeal pharyngeal reflux: Well controlled with omeprazole.  Lumbar degenerative disc disease: Currently getting epidurals from an outside provider, doing well.  Past medical history, Surgical history, Family history not pertinant except as noted below, Social history, Allergies, and medications have been entered into the medical record, reviewed, and no changes needed.   Review of Systems: No fevers, chills, night sweats, weight loss, chest pain, or shortness of breath.   Objective:    General: Well Developed, well nourished, and in no acute distress.  Neuro: Alert and oriented x3, extra-ocular muscles intact, sensation grossly intact.  HEENT: Normocephalic, atraumatic, pupils equal round reactive to light, neck supple, no masses, no lymphadenopathy, thyroid nonpalpable.  Skin: Warm and dry, no rashes. Cardiac: Regular rate and rhythm, no murmurs rubs or gallops, no lower extremity edema.  Respiratory: Clear to auscultation bilaterally. Not using accessory muscles, speaking in full sentences.  Impression and Recommendations:

## 2015-12-31 ENCOUNTER — Other Ambulatory Visit: Payer: Self-pay | Admitting: Sports Medicine

## 2016-02-26 ENCOUNTER — Emergency Department
Admission: EM | Admit: 2016-02-26 | Discharge: 2016-02-26 | Disposition: A | Discharge status: Home / Self Care | Payer: BLUE CROSS/BLUE SHIELD | Source: Home / Self Care | Attending: Emergency Medicine | Admitting: Emergency Medicine

## 2016-02-26 ENCOUNTER — Encounter: Payer: Self-pay | Admitting: Emergency Medicine

## 2016-02-26 DIAGNOSIS — J069 Acute upper respiratory infection, unspecified: Secondary | ICD-10-CM | POA: Diagnosis not present

## 2016-02-26 DIAGNOSIS — J301 Allergic rhinitis due to pollen: Secondary | ICD-10-CM

## 2016-02-26 MED ORDER — PREDNISONE 20 MG PO TABS: 20.0000 mg | ORAL_TABLET | Freq: Two times a day (BID) | ORAL | Status: DC

## 2016-02-26 MED ORDER — FLUTICASONE PROPIONATE 50 MCG/ACT NA SUSP: NASAL | Status: DC

## 2016-02-26 MED ORDER — AMOXICILLIN 875 MG PO TABS: ORAL_TABLET | ORAL | Status: DC

## 2016-02-26 NOTE — ED Notes (Signed)
Pt c/o possible sinus infection, watery eyes, head congestion, drainage, productive cough

## 2016-02-28 NOTE — ED Provider Notes (Signed)
CSN: [id number]     Arrival date & time 02/26/16  1224 History   First MD Initiated Contact with Patient 02/26/16 1348     Chief Complaint  Patient presents with  . URI   (Consider location/radiation/quality/duration/timing/severity/associated sxs/prior Treatment) HPI SINUSITIS symptoms  Onset: 5 days. His main symptoms, he feels, is flare up of seasonal allergies with sinus congestion clearish rhinorrhea, sneezing, itchy watery eyes. For the past 2 days, developed Facial/sinus pressure with scant amount of discolored nasal mucus. Nonproductive cough.    Severity: moderate Tried Claritin-D with partial relief  Symptoms:  No Fever   No Minimal swollen neck glands + mild Sinus Headache + mild ear pressure  Positive for Allergy symptoms No significant Sore Throat     No significant Cough No chest pain No shortness of breath  No wheezing  No Abdominal Pain No Nausea No Vomiting No diarrhea  No Myalgias No focal neurologic symptoms No syncope No Rash  No Urinary symptoms     Past Medical History  Diagnosis Date  . Hypertension    Past Surgical History  Procedure Laterality Date  . Knee surgery      LT   No family history on file. Social History  Substance Use Topics  . Smoking status: Current Some Day Smoker    Types: Cigars  . Smokeless tobacco: None  . Alcohol Use: Yes     Comment: 2 cigars per day.    Review of Systems  All other systems reviewed and are negative.  Remainder of Review of Systems negative Allergies  Shellfish allergy  Home Medications   Prior to Admission medications   Medication Sig Start Date End Date Taking? Authorizing Provider  amoxicillin (AMOXIL) 875 MG tablet Take 1 twice a day X 10 days. 02/26/16   Jacqulyn Cane, MD  fluticasone [facility]) 50 MCG/ACT nasal spray 1 or 2 sprays each nostril twice a day 02/26/16   Jacqulyn Cane, MD  lisinopril-hydrochlorothiazide (PRINZIDE,ZESTORETIC) 20-25 MG tablet TAKE ONE TABLET BY  MOUTH ONE TIME DAILY 01/02/16   Silverio Decamp, MD  predniSONE (DELTASONE) 20 MG tablet Take 1 tablet (20 mg total) by mouth 2 (two) times daily with a meal. X 5 days 02/26/16   Jacqulyn Cane, MD   Meds Ordered and Administered this Visit  Medications - No data to display  BP 132/83 mmHg  Pulse 74  Temp(Src) 98.1 F (36.7 C) (Oral)  Ht 6' 8"  (2.032 m)  Wt 243 lb 8 oz (110.451 kg)  BMI 26.75 kg/m2  SpO2 99% No data found.   Physical Exam  Constitutional: He is oriented to person, place, and time. He appears well-developed and well-nourished. No distress.  HENT:  Head: Normocephalic and atraumatic.  Right Ear: Tympanic membrane, external ear and ear canal normal.  Left Ear: Tympanic membrane, external ear and ear canal normal.  Nose: Mucosal edema and rhinorrhea present. Right sinus exhibits maxillary sinus tenderness. Left sinus exhibits maxillary sinus tenderness.  Mouth/Throat: Oropharynx is clear and moist. No oral lesions. No oropharyngeal exudate.  Nasal mucosa is boggy and pale, consistent with allergic cause. There is clearish rhinorrhea without any discolored rhinorrhea.  Eyes: Right eye exhibits no discharge. Left eye exhibits no discharge. No scleral icterus.  Conjunctiva is slightly inflamed but no exudate.  Neck: Neck supple. No JVD present.  Cardiovascular: Normal rate, regular rhythm and normal heart sounds.   Pulmonary/Chest: Effort normal and breath sounds normal. He has no wheezes. He has no rales.  Abdominal: He exhibits  no distension.  Lymphadenopathy:    He has no cervical adenopathy.  Neurological: He is alert and oriented to person, place, and time. No cranial nerve deficit.  Skin: Skin is warm and dry. No rash noted.  Psychiatric: He has a normal mood and affect.  Nursing note and vitals reviewed.   ED Course  Procedures (including critical care time)  Labs Review Labs Reviewed - No data to display  Imaging Review No results found.   MDM    1. Allergic rhinitis due to pollen   2. Acute upper respiratory infection    Treatment options discussed, as well as risks, benefits, alternatives. Patient voiced understanding and agreement with the following plans: Discharge Medication List as of 02/26/2016  2:03 PM    START taking these medications   Details  amoxicillin (AMOXIL) 875 MG tablet Take 1 twice a day X 10 days., Print    fluticasone (FLONASE) 50 MCG/ACT nasal spray 1 or 2 sprays each nostril twice a day, Normal    predniSONE (DELTASONE) 20 MG tablet Take 1 tablet (20 mg total) by mouth 2 (two) times daily with a meal. X 5 days, Starting 02/26/2016, Until Discontinued, Normal       Advised him to start prednisone and Flonase today, as these will likely help his allergy symptoms. Explained to him that he does not have all signs and symptoms of bacterial sinusitis. However, at his request I prescribed amoxicillin, printed this prescription for him to hold onto, and advised to fill the amoxicillin if he develops discolored rhinorrhea or fever. May continue Claritin-D provided it doesn't elevate BP. Today, his BP 132/83. Follow-up with your primary care doctor in 5-7 days if not improving, or sooner if symptoms become worse. Precautions discussed. Red flags discussed. Questions invited and answered. Patient voiced understanding and agreement.     Jacqulyn Cane, MD 02/28/16 1011

## 2016-06-27 ENCOUNTER — Telehealth: Payer: Self-pay

## 2016-06-27 NOTE — Telephone Encounter (Signed)
Pt left VM stating he has jock itch that you have treated that is getting worse. Would like to know if there is something stronger he can try, please assist.

## 2016-07-05 ENCOUNTER — Ambulatory Visit (INDEPENDENT_AMBULATORY_CARE_PROVIDER_SITE_OTHER): Payer: BLUE CROSS/BLUE SHIELD | Admitting: Sports Medicine

## 2016-07-05 ENCOUNTER — Encounter: Payer: Self-pay | Admitting: Sports Medicine

## 2016-07-05 VITALS — BP 128/78 | HR 73 | Resp 18 | Wt 249.3 lb

## 2016-07-05 DIAGNOSIS — E781 Pure hyperglyceridemia: Secondary | ICD-10-CM | POA: Diagnosis not present

## 2016-07-05 DIAGNOSIS — I10 Essential (primary) hypertension: Secondary | ICD-10-CM | POA: Diagnosis not present

## 2016-07-05 DIAGNOSIS — R7303 Prediabetes: Secondary | ICD-10-CM

## 2016-07-05 DIAGNOSIS — B356 Tinea cruris: Secondary | ICD-10-CM

## 2016-07-05 DIAGNOSIS — Z299 Encounter for prophylactic measures, unspecified: Secondary | ICD-10-CM

## 2016-07-05 LAB — CBC
HCT: 45.6 % (ref 38.5–50.0)
Hemoglobin: 15.6 g/dL (ref 13.2–17.1)
MCH: 30.2 pg (ref 27.0–33.0)
MCHC: 34.2 g/dL (ref 32.0–36.0)
MCV: 88.4 fL (ref 80.0–100.0)
MPV: 10.4 fL (ref 7.5–12.5)
Platelets: 229 K/uL (ref 140–400)
RBC: 5.16 MIL/uL (ref 4.20–5.80)
RDW: 13.8 % (ref 11.0–15.0)
WBC: 5.4 10*3/uL (ref 3.8–10.8)

## 2016-07-05 MED ORDER — CLOTRIMAZOLE-BETAMETHASONE 1-0.05 % EX CREA: 1.0000 "application " | TOPICAL_CREAM | Freq: Two times a day (BID) | CUTANEOUS | Status: DC

## 2016-07-05 NOTE — Progress Notes (Signed)
  Subjective:    CC: Follow-up  HPI: Hypertension: Well controlled   Hyperlipidemia: Due for a recheck.  Prediabetes: Due for recheck of hemoglobin A1c   Skin rash: Groin, pruritic, tried Tinactin and powder without any improvement.  Past medical history, Surgical history, Family history not pertinant except as noted below, Social history, Allergies, and medications have been entered into the medical record, reviewed, and no changes needed.   Review of Systems: No fevers, chills, night sweats, weight loss, chest pain, or shortness of breath.   Objective:    General: Well Developed, well nourished, and in no acute distress.  Neuro: Alert and oriented x3, extra-ocular muscles intact, sensation grossly intact.  HEENT: Normocephalic, atraumatic, pupils equal round reactive to light, neck supple, no masses, no lymphadenopathy, thyroid nonpalpable.  Skin: Warm and dry, Classic tinea cruris. Cardiac: Regular rate and rhythm, no murmurs rubs or gallops, no lower extremity edema.  Respiratory: Clear to auscultation bilaterally. Not using accessory muscles, speaking in full sentences.  Impression and Recommendations:    I spent 25 minutes with this patient, greater than 50% was face-to-face time counseling regarding the above diagnoses

## 2016-07-05 NOTE — Assessment & Plan Note (Signed)
Starting Lotrisone. He will do this for 2 weeks, and return to see me in 4 weeks to recheck the rash.

## 2016-07-05 NOTE — Assessment & Plan Note (Signed)
Rechecking hemoglobin A1c

## 2016-07-05 NOTE — Assessment & Plan Note (Signed)
Well controlled, no changes 

## 2016-07-05 NOTE — Assessment & Plan Note (Signed)
HIV screening

## 2016-07-05 NOTE — Assessment & Plan Note (Signed)
Rechecking labs

## 2016-07-06 LAB — COMPREHENSIVE METABOLIC PANEL WITH GFR
ALT: 39 U/L (ref 9–46)
AST: 32 U/L (ref 10–40)
Albumin: 4.4 g/dL (ref 3.6–5.1)
Alkaline Phosphatase: 41 U/L (ref 40–115)
Calcium: 9.4 mg/dL (ref 8.6–10.3)
Glucose, Bld: 87 mg/dL (ref 65–99)
Potassium: 3.9 mmol/L (ref 3.5–5.3)
Total Bilirubin: 0.4 mg/dL (ref 0.2–1.2)
Total Protein: 7 g/dL (ref 6.1–8.1)

## 2016-07-06 LAB — LIPID PANEL
Cholesterol: 196 mg/dL (ref 125–200)
HDL: 47 mg/dL (ref >=40)
LDL Cholesterol: 111 mg/dL (ref <130)
Total CHOL/HDL Ratio: 4.2 ratio (ref <=5.0)
Triglycerides: 188 mg/dL — ABNORMAL HIGH (ref <150)
VLDL: 38 mg/dL — ABNORMAL HIGH (ref <30)

## 2016-07-06 LAB — HEMOGLOBIN A1C
Hgb A1c MFr Bld: 5.9 % — ABNORMAL HIGH (ref <5.7)
Mean Plasma Glucose: 123 mg/dL

## 2016-07-06 LAB — COMPREHENSIVE METABOLIC PANEL
BUN: 11 mg/dL (ref 7–25)
CO2: 27 mmol/L (ref 20–31)
Chloride: 102 mmol/L (ref 98–110)
Creat: 1.08 mg/dL (ref 0.60–1.35)
Sodium: 139 mmol/L (ref 135–146)

## 2016-07-06 LAB — HIV ANTIBODY (ROUTINE TESTING W REFLEX): HIV 1&2 Ab, 4th Generation: NONREACTIVE

## 2016-08-02 ENCOUNTER — Ambulatory Visit: Payer: BLUE CROSS/BLUE SHIELD | Admitting: Sports Medicine

## 2016-08-30 ENCOUNTER — Encounter: Payer: Self-pay | Admitting: Sports Medicine

## 2016-08-30 ENCOUNTER — Ambulatory Visit (INDEPENDENT_AMBULATORY_CARE_PROVIDER_SITE_OTHER): Payer: BLUE CROSS/BLUE SHIELD | Admitting: Sports Medicine

## 2016-08-30 DIAGNOSIS — I10 Essential (primary) hypertension: Secondary | ICD-10-CM

## 2016-08-30 DIAGNOSIS — E781 Pure hyperglyceridemia: Secondary | ICD-10-CM | POA: Diagnosis not present

## 2016-08-30 MED ORDER — LISINOPRIL-HYDROCHLOROTHIAZIDE 20-25 MG PO TABS: 1.0000 | ORAL_TABLET | Freq: Every day | ORAL | 3 refills | Status: DC

## 2016-08-30 NOTE — Progress Notes (Signed)
  Subjective:    CC: Follow-up  HPI: Hypertriglyceridemia: Has really not been following a diet plan. Would like 3 more months before considering medication.  Tinea cruris: Resolved with Lotrisone.  Hypertension: Well controlled  Past medical history, Surgical history, Family history not pertinant except as noted below, Social history, Allergies, and medications have been entered into the medical record, reviewed, and no changes needed.   Review of Systems: No fevers, chills, night sweats, weight loss, chest pain, or shortness of breath.   Objective:    General: Well Developed, well nourished, and in no acute distress.  Neuro: Alert and oriented x3, extra-ocular muscles intact, sensation grossly intact.  HEENT: Normocephalic, atraumatic, pupils equal round reactive to light, neck supple, no masses, no lymphadenopathy, thyroid nonpalpable.  Skin: Warm and dry, no rashes. Cardiac: Regular rate and rhythm, no murmurs rubs or gallops, no lower extremity edema.  Respiratory: Clear to auscultation bilaterally. Not using accessory muscles, speaking in full sentences.  Impression and Recommendations:    Hypertension Well-controlled, refilling medication.  Hypertriglyceridemia Rechecking lipids. He will do a low carbohydrate low-fat diet. If still elevated we will add niacin.  I spent 25 minutes with this patient, greater than 50% was face-to-face time counseling regarding the above diagnoses

## 2016-08-30 NOTE — Assessment & Plan Note (Signed)
Rechecking lipids. He will do a low carbohydrate low-fat diet. If still elevated we will add niacin.

## 2016-08-30 NOTE — Assessment & Plan Note (Signed)
Well-controlled, refilling medication.

## 2016-12-28 ENCOUNTER — Other Ambulatory Visit: Payer: Self-pay | Admitting: Sports Medicine

## 2017-02-21 ENCOUNTER — Ambulatory Visit (INDEPENDENT_AMBULATORY_CARE_PROVIDER_SITE_OTHER): Payer: BLUE CROSS/BLUE SHIELD

## 2017-02-21 ENCOUNTER — Ambulatory Visit (INDEPENDENT_AMBULATORY_CARE_PROVIDER_SITE_OTHER): Payer: BLUE CROSS/BLUE SHIELD | Admitting: Sports Medicine

## 2017-02-21 DIAGNOSIS — S134XXA Sprain of ligaments of cervical spine, initial encounter: Secondary | ICD-10-CM | POA: Diagnosis not present

## 2017-02-21 DIAGNOSIS — M549 Dorsalgia, unspecified: Secondary | ICD-10-CM | POA: Diagnosis not present

## 2017-02-21 DIAGNOSIS — M542 Cervicalgia: Secondary | ICD-10-CM

## 2017-02-21 MED ORDER — MELOXICAM 15 MG PO TABS: ORAL_TABLET | ORAL | 3 refills | Status: DC

## 2017-02-21 MED ORDER — PREDNISONE 50 MG PO TABS: ORAL_TABLET | ORAL | 0 refills | Status: DC
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
## 2017-09-19 ENCOUNTER — Ambulatory Visit (INDEPENDENT_AMBULATORY_CARE_PROVIDER_SITE_OTHER): Payer: BLUE CROSS/BLUE SHIELD

## 2017-09-19 ENCOUNTER — Ambulatory Visit (INDEPENDENT_AMBULATORY_CARE_PROVIDER_SITE_OTHER): Payer: BLUE CROSS/BLUE SHIELD | Admitting: Sports Medicine

## 2017-09-19 ENCOUNTER — Encounter: Payer: Self-pay | Admitting: Sports Medicine

## 2017-09-19 DIAGNOSIS — R0982 Postnasal drip: Secondary | ICD-10-CM

## 2017-09-19 DIAGNOSIS — R05 Cough: Secondary | ICD-10-CM | POA: Diagnosis not present

## 2017-09-19 MED ORDER — HYDROCOD POLST-CPM POLST ER 10-8 MG/5ML PO SUER: 5.0000 mL | Freq: Two times a day (BID) | ORAL | 0 refills | Status: DC | PRN

## 2017-09-19 MED ORDER — FLUTICASONE PROPIONATE 50 MCG/ACT NA SUSP: NASAL | 3 refills | Status: DC

## 2017-09-19 NOTE — Assessment & Plan Note (Signed)
Flonase, Tussionex at night. Chest x-ray. Return as needed.

## 2017-09-19 NOTE — Progress Notes (Signed)
  Subjective:    CC: Feeling sick  HPI: For the past week this pleasant 46 year old male has had increasing runny nose, postnasal drip with mild sore throat, leading to a cough. Overall feels okay without malaise or myalgias, mild sinus pressure and right-sided ear fullness. No constitutional symptoms, no GI symptoms.  Past medical history:  Negative.  See flowsheet/record as well for more information.  Surgical history: Negative.  See flowsheet/record as well for more information.  Family history: Negative.  See flowsheet/record as well for more information.  Social history: Negative.  See flowsheet/record as well for more information.  Allergies, and medications have been entered into the medical record, reviewed, and no changes needed.   Review of Systems: No fevers, chills, night sweats, weight loss, chest pain, or shortness of breath.   Objective:    General: Well Developed, well nourished, and in no acute distress.  Neuro: Alert and oriented x3, extra-ocular muscles intact, sensation grossly intact.  HEENT: Normocephalic, atraumatic, pupils equal round reactive to light, neck supple, no masses, no lymphadenopathy, thyroid nonpalpable. Oropharynx, ear canals unremarkable, nasopharynx is boggy, erythematous turbinates with copious mucus. Skin: Warm and dry, no rashes. Cardiac: Regular rate and rhythm, no murmurs rubs or gallops, no lower extremity edema.  Respiratory: Slightly coarse sounds that maybe radiating sound from his oropharynx. Not using accessory muscles, speaking in full sentences.  Impression and Recommendations:    Postnasal drip with cough Flonase, Tussionex at night. Chest x-ray. Return as needed.  ___________________________________________ Gwen Her. Dianah Field, M.D., ABFM., CAQSM. Primary Care and [place] Instructor of [place] of [facility] of [employer]

## 2017-12-14 ENCOUNTER — Other Ambulatory Visit: Payer: Self-pay | Admitting: Sports Medicine

## 2018-03-16 ENCOUNTER — Other Ambulatory Visit: Payer: Self-pay | Admitting: Sports Medicine

## 2018-04-09 ENCOUNTER — Other Ambulatory Visit: Payer: Self-pay | Admitting: Sports Medicine

## 2018-04-15 ENCOUNTER — Ambulatory Visit: Payer: BLUE CROSS/BLUE SHIELD | Admitting: Sports Medicine

## 2018-04-15 VITALS — BP 149/94 | HR 71 | Resp 18 | Wt 249.0 lb

## 2018-04-15 DIAGNOSIS — Z23 Encounter for immunization: Secondary | ICD-10-CM | POA: Diagnosis not present

## 2018-04-15 DIAGNOSIS — E781 Pure hyperglyceridemia: Secondary | ICD-10-CM | POA: Diagnosis not present

## 2018-04-15 DIAGNOSIS — I10 Essential (primary) hypertension: Secondary | ICD-10-CM

## 2018-04-15 DIAGNOSIS — Z0000 Encounter for general adult medical examination without abnormal findings: Secondary | ICD-10-CM

## 2018-04-15 LAB — CBC
HCT: 47 % (ref 38.5–50.0)
Hemoglobin: 16.1 g/dL (ref 13.2–17.1)
MCH: 29 pg (ref 27.0–33.0)
MCHC: 34.3 g/dL (ref 32.0–36.0)
MCV: 84.7 fL (ref 80.0–100.0)
MPV: 10.1 fL (ref 7.5–12.5)
Platelets: 211 10*3/uL (ref 140–400)
RBC: 5.55 10*6/uL (ref 4.20–5.80)
RDW: 13.2 % (ref 11.0–15.0)
WBC: 4.9 10*3/uL (ref 3.8–10.8)

## 2018-04-15 LAB — LIPID PANEL W/REFLEX DIRECT LDL
Cholesterol: 223 mg/dL — ABNORMAL HIGH (ref <200)
HDL: 48 mg/dL (ref >40)
LDL Cholesterol (Calc): 140 mg/dL — ABNORMAL HIGH
Non-HDL Cholesterol (Calc): 175 mg/dL (calc) — ABNORMAL HIGH (ref <130)
Total CHOL/HDL Ratio: 4.6 (calc) (ref <5.0)
Triglycerides: 209 mg/dL — ABNORMAL HIGH (ref <150)

## 2018-04-15 LAB — COMPREHENSIVE METABOLIC PANEL
AST: 31 U/L (ref 10–40)
Albumin: 4.8 g/dL (ref 3.6–5.1)
Alkaline phosphatase (APISO): 62 U/L (ref 40–115)
Chloride: 103 mmol/L (ref 98–110)
Creat: 0.99 mg/dL (ref 0.60–1.35)
Globulin: 3.3 g/dL (calc) (ref 1.9–3.7)
Glucose, Bld: 96 mg/dL (ref 65–99)

## 2018-04-15 LAB — COMPREHENSIVE METABOLIC PANEL WITH GFR
AG Ratio: 1.5 (calc) (ref 1.0–2.5)
ALT: 46 U/L (ref 9–46)
BUN: 8 mg/dL (ref 7–25)
CO2: 27 mmol/L (ref 20–32)
Calcium: 9.9 mg/dL (ref 8.6–10.3)
Potassium: 3.8 mmol/L (ref 3.5–5.3)
Sodium: 141 mmol/L (ref 135–146)
Total Bilirubin: 0.4 mg/dL (ref 0.2–1.2)
Total Protein: 8.1 g/dL (ref 6.1–8.1)

## 2018-04-15 LAB — TSH: TSH: 2.8 m[IU]/L (ref 0.40–4.50)

## 2018-04-15 MED ORDER — AMLODIPINE BESYLATE 5 MG PO TABS: 5.0000 mg | ORAL_TABLET | Freq: Every day | ORAL | 3 refills | Status: DC

## 2018-04-15 NOTE — Assessment & Plan Note (Signed)
Rechecking lipids

## 2018-04-15 NOTE — Progress Notes (Signed)
.  Subjective:    CC: Blood pressure  HPI: Hypertension: Well-controlled historically, more recently has been running elevated, no headaches, visual changes, chest pain.  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  Hypertriglyceridemia: Due to recheck lipids.  I reviewed the past medical history, family history, social history, surgical history, and allergies today and no changes were needed.  Please see the problem list section below in epic for further details.  Past Medical History: Past Medical History:  Diagnosis Date  . Hypertension    Past Surgical History: Past Surgical History:  Procedure Laterality Date  . KNEE SURGERY     LT   Social History: Social History   Socioeconomic History  . Marital status: Married    Spouse name: Not on file  . Number of children: Not on file  . Years of education: Not on file  . Highest education level: Not on file  Occupational History  . Not on file  Social Needs  . Financial resource strain: Not on file  . Food insecurity:    Worry: Not on file    Inability: Not on file  . Transportation needs:    Medical: Not on file    Non-medical: Not on file  Tobacco Use  . Smoking status: Current Some Day Smoker    Types: Cigars  . Smokeless tobacco: Never Used  Substance and Sexual Activity  . Alcohol use: Yes    Comment: 2 cigars per day.  . Drug use: No  . Sexual activity: Not on file  Lifestyle  . Physical activity:    Days per week: Not on file    Minutes per session: Not on file  . Stress: Not on file  Relationships  . Social connections:    Talks on phone: Not on file    Gets together: Not on file    Attends religious service: Not on file    Active member of club or organization: Not on file    Attends meetings of clubs or organizations: Not on file    Relationship status:  Not on file  Other Topics Concern  . Not on file  Social History Narrative  . Not on file   Family History: No family history on file. Allergies: Allergies  Allergen Reactions  . Shellfish Allergy Shortness Of Breath, Itching and Swelling   Medications: See med rec.  Review of Systems: No fevers, chills, night sweats, weight loss, chest pain, or shortness of breath.   Objective:    General: Well Developed, well nourished, and in no acute distress.  Neuro: Alert and oriented x3, extra-ocular muscles intact, sensation grossly intact.  HEENT: Normocephalic, atraumatic, pupils equal round reactive to light, neck supple, no masses, no lymphadenopathy, thyroid nonpalpable.  Skin: Warm and dry, no rashes. Cardiac: Regular rate and rhythm, no murmurs rubs or gallops, no lower extremity edema.  Respiratory: Clear to auscultation bilaterally. Not using accessory muscles, speaking in full sentences.  Impression and Recommendations:    Annual physical exam TDap today. Routine labs.   Hypertension Historically controlled, elevated today but having some family stressors, wife is expecting their first son. Checking some routine labs. 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 Return in 2 weeks for a nurse visit blood pressure check.  Hypertriglyceridemia Rechecking  lipids  I spent 25 minutes with this patient, greater than 50% was face-to-face time counseling regarding the above diagnoses ___________________________________________ Gwen Her. Dianah Field, M.D., ABFM., CAQSM. Primary Care and [place] Instructor of [place] of [facility] of Medicine

## 2018-04-15 NOTE — Addendum Note (Signed)
Addended by: Elizabeth Sauer on: 04/15/2018 11:07 AM   Modules accepted: Orders

## 2018-04-15 NOTE — Assessment & Plan Note (Addendum)
Historically controlled, elevated today but having some family stressors, wife is expecting their first son. Checking some routine labs. 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 Return in 2 weeks for a nurse visit blood pressure check.

## 2018-04-15 NOTE — Assessment & Plan Note (Signed)
TDap today. Routine labs.

## 2018-04-16 LAB — HEMOGLOBIN A1C
Hgb A1c MFr Bld: 5.9 %{Hb} — ABNORMAL HIGH (ref <5.7)
Mean Plasma Glucose: 123 (calc)
eAG (mmol/L): 6.8 (calc)

## 2018-04-18 ENCOUNTER — Telehealth: Payer: Self-pay

## 2018-04-18 NOTE — Telephone Encounter (Signed)
Pt left VM stating he thinks his BP was elevated because he's been using OTC medications for his allergies. Would like to know if you will send him something for them. Please assist.

## 2018-04-19 NOTE — Telephone Encounter (Signed)
It depends, is he using anything with a decongenstant?  (phenylephrine or pseudoephedrine)  If its just an antihistamine then that won't affect his blood pressure.  I would also like him to get a home BP cuff and check his BP and document the numbers.

## 2018-04-29 ENCOUNTER — Ambulatory Visit: Payer: BLUE CROSS/BLUE SHIELD

## 2018-04-30 ENCOUNTER — Ambulatory Visit (INDEPENDENT_AMBULATORY_CARE_PROVIDER_SITE_OTHER): Payer: BLUE CROSS/BLUE SHIELD | Admitting: Sports Medicine

## 2018-04-30 VITALS — BP 129/86 | HR 66

## 2018-04-30 DIAGNOSIS — I10 Essential (primary) hypertension: Secondary | ICD-10-CM

## 2018-04-30 MED ORDER — LISINOPRIL-HYDROCHLOROTHIAZIDE 20-25 MG PO TABS: 1.0000 | ORAL_TABLET | Freq: Every day | ORAL | 3 refills | Status: DC
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
## 2018-04-30 NOTE — Progress Notes (Signed)
Pt advised. Letter has been placed upfront.

## 2018-08-04 ENCOUNTER — Other Ambulatory Visit: Payer: Self-pay | Admitting: Sports Medicine

## 2018-08-04 DIAGNOSIS — I10 Essential (primary) hypertension: Secondary | ICD-10-CM

## 2019-01-26 ENCOUNTER — Other Ambulatory Visit: Payer: Self-pay | Admitting: Sports Medicine

## 2019-01-26 DIAGNOSIS — I10 Essential (primary) hypertension: Secondary | ICD-10-CM

## 2019-02-13 ENCOUNTER — Ambulatory Visit (INDEPENDENT_AMBULATORY_CARE_PROVIDER_SITE_OTHER): Payer: BLUE CROSS/BLUE SHIELD

## 2019-02-13 ENCOUNTER — Ambulatory Visit (INDEPENDENT_AMBULATORY_CARE_PROVIDER_SITE_OTHER): Payer: BLUE CROSS/BLUE SHIELD | Admitting: Sports Medicine

## 2019-02-13 ENCOUNTER — Encounter: Payer: Self-pay | Admitting: Sports Medicine

## 2019-02-13 DIAGNOSIS — R059 Cough, unspecified: Secondary | ICD-10-CM

## 2019-02-13 DIAGNOSIS — J181 Lobar pneumonia, unspecified organism: Secondary | ICD-10-CM

## 2019-02-13 DIAGNOSIS — R05 Cough: Secondary | ICD-10-CM

## 2019-02-13 MED ORDER — HYDROCOD POLST-CPM POLST ER 10-8 MG/5ML PO SUER: 5.0000 mL | Freq: Two times a day (BID) | ORAL | 0 refills | Status: DC | PRN

## 2019-02-13 MED ORDER — PREDNISONE 50 MG PO TABS: 50.0000 mg | ORAL_TABLET | Freq: Every day | ORAL | 0 refills | Status: DC

## 2019-02-13 MED ORDER — BENZONATATE 200 MG PO CAPS: 200.0000 mg | ORAL_CAPSULE | Freq: Three times a day (TID) | ORAL | 0 refills | Status: DC | PRN

## 2019-02-13 NOTE — Progress Notes (Addendum)
Subjective:    CC: Feeling sick  HPI: This is a pleasant 48 year old male, for a week now he said fevers, chills, mild sore throat, productive cough.  No muscle aches, mild headache.  Symptoms are improving slowly, no shortness of breath, only minimal chest pain from coughing.  No GI symptoms, no skin rash.  I reviewed the past medical history, family history, social history, surgical history, and allergies today and no changes were needed.  Please see the problem list section below in epic for further details.  Past Medical History: Past Medical History:  Diagnosis Date  . Hypertension    Past Surgical History: Past Surgical History:  Procedure Laterality Date  . KNEE SURGERY     LT   Social History: Social History   Socioeconomic History  . Marital status: Married    Spouse name: Not on file  . Number of children: Not on file  . Years of education: Not on file  . Highest education level: Not on file  Occupational History  . Not on file  Social Needs  . Financial resource strain: Not on file  . Food insecurity:    Worry: Not on file    Inability: Not on file  . Transportation needs:    Medical: Not on file    Non-medical: Not on file  Tobacco Use  . Smoking status: Current Some Day Smoker    Types: Cigars  . Smokeless tobacco: Never Used  Substance and Sexual Activity  . Alcohol use: Yes    Comment: 2 cigars per day.  . Drug use: No  . Sexual activity: Not on file  Lifestyle  . Physical activity:    Days per week: Not on file    Minutes per session: Not on file  . Stress: Not on file  Relationships  . Social connections:    Talks on phone: Not on file    Gets together: Not on file    Attends religious service: Not on file    Active member of club or organization: Not on file    Attends meetings of clubs or organizations: Not on file    Relationship status: Not on file  Other Topics Concern  . Not on file  Social History Narrative  . Not on file    Family History: No family history on file. Allergies: Allergies  Allergen Reactions  . Shellfish Allergy Shortness Of Breath, Itching and Swelling   Medications: See med rec.  Review of Systems: No fevers, chills, night sweats, weight loss, chest pain, or shortness of breath.   Objective:    General: Well Developed, well nourished, and in no acute distress.  Neuro: Alert and oriented x3, extra-ocular muscles intact, sensation grossly intact.  HEENT: Normocephalic, atraumatic, pupils equal round reactive to light, neck supple, no masses, no lymphadenopathy, thyroid nonpalpable.  Oropharynx, nasopharynx, ear canals unremarkable. Skin: Warm and dry, no rashes. Cardiac: Regular rate and rhythm, no murmurs rubs or gallops, no lower extremity edema.  Respiratory: Inspiratory and expiratory wheeze in the right middle lung. Not using accessory muscles, speaking in full sentences.  Impression and Recommendations:    Coughing Influenza-like illness with symptoms that started a week ago. Persistent cough. Right middle lobe wheezing. Tessalon Perles, Tussionex, prednisone. Chest x-ray to evaluate for infiltrate. Return to see me as needed.  RML pneumonia, adding azithromycin. ___________________________________________ Gwen Her. Dianah Field, M.D., ABFM., CAQSM. Primary Care and Sports Medicine [facility]  Adjunct Professor of [place]  [employer] of [employer]

## 2019-02-13 NOTE — Assessment & Plan Note (Addendum)
Influenza-like illness with symptoms that started a week ago. Persistent cough. Right middle lobe wheezing. Tessalon Perles, Tussionex, prednisone. Chest x-ray to evaluate for infiltrate. Return to see me as needed.  RML pneumonia, adding azithromycin.

## 2019-02-14 MED ORDER — AZITHROMYCIN 250 MG PO TABS: ORAL_TABLET | ORAL | 0 refills | Status: DC

## 2019-02-14 NOTE — Addendum Note (Signed)
Addended by: Silverio Decamp on: 02/14/2019 11:54 AM   Modules accepted: Orders

## 2019-04-24 ENCOUNTER — Other Ambulatory Visit: Payer: Self-pay | Admitting: Sports Medicine

## 2019-08-23 ENCOUNTER — Other Ambulatory Visit: Payer: Self-pay | Admitting: Sports Medicine

## 2019-08-23 DIAGNOSIS — I10 Essential (primary) hypertension: Secondary | ICD-10-CM

## 2020-03-27 ENCOUNTER — Other Ambulatory Visit: Payer: Self-pay | Admitting: Sports Medicine

## 2020-03-27 DIAGNOSIS — I10 Essential (primary) hypertension: Secondary | ICD-10-CM

## 2020-04-10 ENCOUNTER — Other Ambulatory Visit: Payer: Self-pay | Admitting: Sports Medicine

## 2020-04-10 DIAGNOSIS — I10 Essential (primary) hypertension: Secondary | ICD-10-CM

## 2020-08-15 ENCOUNTER — Other Ambulatory Visit: Payer: Self-pay | Admitting: Sports Medicine

## 2020-08-15 DIAGNOSIS — I10 Essential (primary) hypertension: Secondary | ICD-10-CM

## 2020-08-29 ENCOUNTER — Encounter: Payer: Self-pay | Admitting: Sports Medicine

## 2020-08-29 ENCOUNTER — Ambulatory Visit (INDEPENDENT_AMBULATORY_CARE_PROVIDER_SITE_OTHER): Payer: BLUE CROSS/BLUE SHIELD | Admitting: Sports Medicine

## 2020-08-29 ENCOUNTER — Other Ambulatory Visit: Payer: Self-pay

## 2020-08-29 DIAGNOSIS — I10 Essential (primary) hypertension: Secondary | ICD-10-CM

## 2020-08-29 DIAGNOSIS — R7303 Prediabetes: Secondary | ICD-10-CM

## 2020-08-29 NOTE — Progress Notes (Signed)
    Procedures performed today:    None.  Independent interpretation of notes and tests performed by another provider:   None.  Brief History, Exam, Impression, and Recommendations:    Hypertension Continue current medications, blood pressure was well controlled, it sounds like he had a little bit of difficulty with his DOT physical, he will return to them. He does need to work on significant weight loss. 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    ___________________________________________ Gwen Her. Dianah Field, M.D., ABFM., CAQSM. Primary Care and [place] Instructor of [place] of [facility] of Medicine

## 2020-08-29 NOTE — Assessment & Plan Note (Signed)
Continue current medications, blood pressure was well controlled, it sounds like he had a little bit of difficulty with his DOT physical, he will return to them. He does need to work on significant weight loss. 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

## 2020-09-06 LAB — COMPLETE METABOLIC PANEL WITH GFR
AG Ratio: 1.4 (calc) (ref 1.0–2.5)
ALT: 28 U/L (ref 9–46)
AST: 23 U/L (ref 10–40)
Albumin: 4.4 g/dL (ref 3.6–5.1)
Alkaline phosphatase (APISO): 49 U/L (ref 36–130)
BUN: 9 mg/dL (ref 7–25)
CO2: 28 mmol/L (ref 20–32)
Calcium: 9.6 mg/dL (ref 8.6–10.3)
Chloride: 103 mmol/L (ref 98–110)
Creat: 1.01 mg/dL (ref 0.60–1.35)
GFR, Est African American: 102 mL/min/{1.73_m2} (ref >=60)
GFR, Est Non African American: 88 mL/min/{1.73_m2} (ref >=60)
Globulin: 3.1 g/dL (calc) (ref 1.9–3.7)
Glucose, Bld: 95 mg/dL (ref 65–99)
Potassium: 4.1 mmol/L (ref 3.5–5.3)
Sodium: 140 mmol/L (ref 135–146)
Total Bilirubin: 0.3 mg/dL (ref 0.2–1.2)
Total Protein: 7.5 g/dL (ref 6.1–8.1)

## 2020-09-06 LAB — CBC
HCT: 44.5 % (ref 38.5–50.0)
Hemoglobin: 15.3 g/dL (ref 13.2–17.1)
MCH: 29.9 pg (ref 27.0–33.0)
MCHC: 34.4 g/dL (ref 32.0–36.0)
MCV: 86.9 fL (ref 80.0–100.0)
MPV: 10.5 fL (ref 7.5–12.5)
Platelets: 201 10*3/uL (ref 140–400)
RBC: 5.12 10*6/uL (ref 4.20–5.80)
RDW: 13.1 % (ref 11.0–15.0)
WBC: 4.8 10*3/uL (ref 3.8–10.8)

## 2020-09-06 LAB — TSH: TSH: 2.37 mIU/L (ref 0.40–4.50)

## 2020-09-06 LAB — LIPID PANEL W/REFLEX DIRECT LDL
Cholesterol: 174 mg/dL (ref <200)
HDL: 47 mg/dL (ref >=40)
LDL Cholesterol (Calc): 105 mg/dL (calc) — ABNORMAL HIGH
Non-HDL Cholesterol (Calc): 127 mg/dL (calc) (ref <130)
Total CHOL/HDL Ratio: 3.7 (calc) (ref <5.0)
Triglycerides: 119 mg/dL (ref <150)

## 2020-09-06 LAB — HEMOGLOBIN A1C
Hgb A1c MFr Bld: 5.9 % of total Hgb — ABNORMAL HIGH (ref <5.7)
Mean Plasma Glucose: 123 (calc)
eAG (mmol/L): 6.8 (calc)

## 2020-09-28 ENCOUNTER — Ambulatory Visit (INDEPENDENT_AMBULATORY_CARE_PROVIDER_SITE_OTHER): Payer: BLUE CROSS/BLUE SHIELD | Admitting: Sports Medicine

## 2020-09-28 DIAGNOSIS — M5416 Radiculopathy, lumbar region: Secondary | ICD-10-CM | POA: Diagnosis not present

## 2020-09-28 MED ORDER — PREDNISONE 50 MG PO TABS: ORAL_TABLET | ORAL | 0 refills | Status: DC

## 2020-09-28 NOTE — Progress Notes (Signed)
    Procedures performed today:    None.  Independent interpretation of notes and tests performed by another provider:   None.  Brief History, Exam, Impression, and Recommendations:    Left L5 lumbar radiculopathy Nicholas Pearson is a very pleasant 49 year old male, approximately 6 years ago he had an episode of lumbar radiculitis, ultimately he failed conservative treatment and needed a lumbar epidural with [facility] imaging. He is having recurrence of symptoms, axial with left-sided L5 radiculitis. We will start with a burst of prednisone, because of his need for aggressive interventional treatment last time we are going to also proceed with x-rays and an MRI, because of insurance this needs to be done through [facility]. If he needs an epidural this will also need to be ordered through [facility] interventional radiology. We can touch base through MyChart message in 5 days after the burst of prednisone and he will let me know if he still needs the injection.    ___________________________________________ Gwen Her. Dianah Field, M.D., ABFM., CAQSM. Primary Care and [place] Instructor of [place] of [facility] of Medicine

## 2020-09-28 NOTE — Assessment & Plan Note (Signed)
Nicholas Pearson is a very pleasant 49 year old male, approximately 6 years ago he had an episode of lumbar radiculitis, ultimately he failed conservative treatment and needed a lumbar epidural with [facility] imaging. He is having recurrence of symptoms, axial with left-sided L5 radiculitis. We will start with a burst of prednisone, because of his need for aggressive interventional treatment last time we are going to also proceed with x-rays and an MRI, because of insurance this needs to be done through [facility]. If he needs an epidural this will also need to be ordered through [facility] interventional radiology. We can touch base through MyChart message in 5 days after the burst of prednisone and he will let me know if he still needs the injection.

## 2020-11-17 ENCOUNTER — Other Ambulatory Visit: Payer: Self-pay

## 2020-11-17 DIAGNOSIS — M5416 Radiculopathy, lumbar region: Secondary | ICD-10-CM

## 2020-11-17 DIAGNOSIS — I10 Essential (primary) hypertension: Secondary | ICD-10-CM

## 2020-11-17 MED ORDER — PREDNISONE 50 MG PO TABS: ORAL_TABLET | ORAL | 0 refills | Status: DC

## 2020-11-17 MED ORDER — AMLODIPINE BESYLATE 5 MG PO TABS: 5.0000 mg | ORAL_TABLET | Freq: Every day | ORAL | 3 refills | Status: DC

## 2020-11-17 MED ORDER — LISINOPRIL-HYDROCHLOROTHIAZIDE 20-25 MG PO TABS
1.0000 | ORAL_TABLET | Freq: Every day | ORAL | 3 refills | Status: DC
Start: 2020-11-17 — End: 2021-11-30

## 2020-11-17 NOTE — Telephone Encounter (Signed)
Patient called requesting a refill on his BP meds.  He was just seen in September. Refills pending for approval. Patient also requested a refill on the prednisone for back pain.  Also pending for approval.

## 2021-10-06 ENCOUNTER — Emergency Department (INDEPENDENT_AMBULATORY_CARE_PROVIDER_SITE_OTHER)
Admission: EM | Admit: 2021-10-06 | Discharge: 2021-10-06 | Disposition: A | Discharge status: Home / Self Care | Payer: Worker's Compensation | Source: Home / Self Care

## 2021-10-06 ENCOUNTER — Emergency Department (INDEPENDENT_AMBULATORY_CARE_PROVIDER_SITE_OTHER): Payer: Worker's Compensation

## 2021-10-06 DIAGNOSIS — M25461 Effusion, right knee: Secondary | ICD-10-CM

## 2021-10-06 DIAGNOSIS — S82001B Unspecified fracture of right patella, initial encounter for open fracture type I or II: Secondary | ICD-10-CM

## 2021-10-06 DIAGNOSIS — M25561 Pain in right knee: Secondary | ICD-10-CM

## 2021-10-06 MED ORDER — HYDROCODONE-ACETAMINOPHEN 5-325 MG PO TABS: 1.0000 | ORAL_TABLET | Freq: Three times a day (TID) | ORAL | 0 refills | Status: DC | PRN

## 2021-10-06 MED ORDER — IBUPROFEN 800 MG PO TABS: 800.0000 mg | ORAL_TABLET | Freq: Three times a day (TID) | ORAL | 0 refills | Status: DC

## 2021-10-06 NOTE — ED Provider Notes (Signed)
[facility]    CSN: [id number] Arrival date & time: 10/06/21  1716      History   Chief Complaint Chief Complaint  Patient presents with   Knee Injury    RT    HPI Nicholas Pearson is a 50 y.o. male.   HPI 50 year old male presents with workplace injury reports around 39 AM this morning he was working between his tractor trailer and truck itself when he accidentally slipped falling to the ground directly onto his right knee.  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  Past Medical History:  Diagnosis Date   Hypertension     Patient Active Problem List   Diagnosis Date Noted   Hypertriglyceridemia 05/18/2014   Prediabetes 05/18/2014   Annual physical exam 05/17/2014   LPRD (laryngopharyngeal reflux disease) 04/10/2013   Smoker 12/04/2012   Hypertension 12/04/2012   Left L5 lumbar radiculopathy 11/04/2012    Past Surgical History:  Procedure Laterality Date   KNEE SURGERY     LT       Home Medications    Prior to Admission medications   Medication Sig Start Date End Date Taking? Authorizing Provider  HYDROcodone-acetaminophen (NORCO) 5-325 MG tablet Take 1 tablet by mouth 3 (three) times daily as needed for moderate pain. 10/06/21  Yes Eliezer Lofts, FNP  ibuprofen (ADVIL) 800 MG tablet Take 1 tablet (800 mg total) by mouth 3 (three) times daily. 10/06/21  Yes Eliezer Lofts, FNP  amLODipine (NORVASC) 5 MG tablet Take 1 tablet (5 mg total) by mouth daily. 11/17/20   Silverio Decamp, MD  lisinopril-hydrochlorothiazide (ZESTORETIC) 20-25 MG tablet Take 1 tablet by mouth daily. 11/17/20   Silverio Decamp, MD  predniSONE (DELTASONE) 50 MG tablet One tab PO daily for 5 days. 11/17/20   Silverio Decamp, MD    Family History History reviewed. No pertinent family history.  Social History Social History   Tobacco Use   Smoking  status: Some Days    Types: Cigars   Smokeless tobacco: Never  Substance Use Topics   Alcohol use: Yes    Comment: 2 cigars per day.   Drug use: No     Allergies   Shellfish allergy   Review of Systems Review of Systems  Musculoskeletal:        Right knee pain since 1100 am this morning  All other systems reviewed and are negative.   Physical Exam Triage Vital Signs ED Triage Vitals  Enc Vitals Group     BP 10/06/21 1802 (!) 149/104     Pulse Rate 10/06/21 1802 80     Resp 10/06/21 1802 18     Temp 10/06/21 1802 98.2 F (36.8 C)     Temp Source 10/06/21 1802 Oral     SpO2 10/06/21 1802 99 %     Weight --      Height --      Head Circumference --      Peak Flow --      Pain Score 10/06/21 1758 5     Pain Loc --      Pain Edu? --      Excl. in [place]? --    No data found.  Updated Vital Signs BP (!) 149/104 (BP Location: Left Arm)   Pulse 80   Temp 98.2 F (36.8 C) (Oral)   Resp 18   SpO2 99%  Physical Exam Vitals and nursing note reviewed.  Constitutional:      Appearance: Normal appearance. He is normal weight.  HENT:     Head: Normocephalic and atraumatic.     Mouth/Throat:     Mouth: Mucous membranes are moist.     Pharynx: Oropharynx is clear.  Eyes:     Extraocular Movements: Extraocular movements intact.     Pupils: Pupils are equal, round, and reactive to light.  Cardiovascular:     Rate and Rhythm: Normal rate and regular rhythm.     Pulses: Normal pulses.     Heart sounds: Normal heart sounds.  Pulmonary:     Effort: Pulmonary effort is normal.     Breath sounds: Normal breath sounds.  Musculoskeletal:     Cervical back: Normal range of motion and neck supple.     Comments: Right knee: Exam limited due to pain today  Skin:    General: Skin is warm and dry.  Neurological:     General: No focal deficit present.     Mental Status: He is alert and oriented to person, place, and time.     UC Treatments / Results  Labs (all labs  ordered are listed, but only abnormal results are displayed) Labs Reviewed - No data to display  EKG   Radiology DG Knee Complete 4 Views Right  Result Date: 10/06/2021 CLINICAL DATA:  [place] off of truck, right knee pain and swelling EXAM: RIGHT KNEE - COMPLETE 4+ VIEW COMPARISON:  None. FINDINGS: Frontal, bilateral oblique, lateral, and sunrise views of the right knee are obtained. There is a fracture through the inferior margin of the lower pole patella enthesophyte, with superior displacement of the patella. This may signify underlying patellar tendon disruption. There is significant infrapatellar soft tissue swelling. No joint effusion. The remainder of the bony structures are intact. Mild medial compartmental osteoarthritis. IMPRESSION: 1. Displaced fracture through the lower pole it these of fight of the patella, with superior displacement of the patella in the trochlear groove. This likely represents underlying patellar tendon disruption. 2. Significant infrapatellar soft tissue swelling. Electronically Signed   By: Randa Ngo M.D.   On: 10/06/2021 18:49    Procedures Procedures (including critical care time)  Medications Ordered in UC Medications - No data to display  Initial Impression / Assessment and Plan / UC Course  I have reviewed the triage vital signs and the nursing notes.  Pertinent labs & imaging results that were available during my care of the patient were reviewed by me and considered in my medical decision making (see chart for details).     MDM: 1. Right knee pain-x-ray of right knee revealed (above); 2.  Patellar fracture likely representing underlying patellar tendon disruption-Rx'd Norco and advised patient follow-up with Dr. Darene Lamer (his PCP), Patrica Duel, MD on Monday morning, 10/09/2021 for right knee evaluation.  Patient discharged home, hemodynamically stable. Advised patient of right knee x-ray results.  Advised patient may use ibuprofen 800 mg 1-2 times  daily, as needed.  Advised patient to use pain medication sparingly.  Advised patient may RICE right knee for 25 minutes 2-3 times daily for the next 2 to 3 days to help with swelling.  Advised patient to follow-up with [facility] orthopedic provider above or Dr. Darene Lamer first thing Monday morning.  Advised patient to wear right knee brace 24/7 until meeting with orthopedic provider.  Encourage patient to stay off of right knee is much as possible and that he should be  evaluated by orthopedic provider for further evaluation prior to returning to work Final Clinical Impressions(s) / UC Diagnoses   Final diagnoses:  Type I or II open fracture of right patella, unspecified fracture morphology, initial encounter  Acute pain of right knee     Discharge Instructions      Advised patient of right knee x-ray results.  Advised patient may use ibuprofen 800 mg 1-2 times daily, as needed.  Advised patient to use pain medication sparingly.  Advised patient may RICE right knee for 25 minutes 2-3 times daily for the next 2 to 3 days to help with swelling.  Advised patient to follow-up with [facility] orthopedic provider above or Dr. Darene Lamer first thing Monday morning.  Advised patient to wear right knee brace 24/7 until meeting with orthopedic provider.     ED Prescriptions     Medication Sig Dispense Auth. Provider   HYDROcodone-acetaminophen (NORCO) 5-325 MG tablet Take 1 tablet by mouth 3 (three) times daily as needed for moderate pain. 30 tablet Eliezer Lofts, FNP   ibuprofen (ADVIL) 800 MG tablet Take 1 tablet (800 mg total) by mouth 3 (three) times daily. 30 tablet Eliezer Lofts, FNP      I have reviewed the PDMP during this encounter.   Eliezer Lofts, [place] 10/06/21 1934

## 2021-10-06 NOTE — ED Triage Notes (Signed)
Pt c/o RT knee pain since 11am. Says he fell off the back of a work truck landing on the concrete. Swelling noted. Pain 5/10 Pt a little hypertensive today. Has not taken BP meds today yet.

## 2021-10-06 NOTE — Discharge Instructions (Addendum)
Advised patient of right knee x-ray results.  Advised patient may use ibuprofen 800 mg 1-2 times daily, as needed.  Advised patient to use pain medication sparingly.  Advised patient may RICE right knee for 25 minutes 2-3 times daily for the next 2 to 3 days to help with swelling.  Advised patient to follow-up with [facility] orthopedic provider above or Dr. Darene Lamer first thing Monday morning.  Advised patient to wear right knee brace 24/7 until meeting with orthopedic provider.  Encourage patient to stay off of right knee as much as possible and that he should be evaluated by orthopedic provider for further evaluation prior to returning to work.

## 2021-10-10 ENCOUNTER — Ambulatory Visit (INDEPENDENT_AMBULATORY_CARE_PROVIDER_SITE_OTHER): Payer: Worker's Compensation | Admitting: Sports Medicine

## 2021-10-10 ENCOUNTER — Other Ambulatory Visit: Payer: Self-pay

## 2021-10-10 ENCOUNTER — Ambulatory Visit (INDEPENDENT_AMBULATORY_CARE_PROVIDER_SITE_OTHER): Payer: Worker's Compensation

## 2021-10-10 ENCOUNTER — Ambulatory Visit: Payer: 59 | Admitting: Family Medicine

## 2021-10-10 DIAGNOSIS — S82021A Displaced longitudinal fracture of right patella, initial encounter for closed fracture: Secondary | ICD-10-CM | POA: Diagnosis not present

## 2021-10-10 DIAGNOSIS — S82001A Unspecified fracture of right patella, initial encounter for closed fracture: Secondary | ICD-10-CM | POA: Insufficient documentation

## 2021-10-10 NOTE — Progress Notes (Signed)
    Procedures performed today:    None.  Independent interpretation of notes and tests performed by another provider:   X-rays personally reviewed, I do see a displaced mid patellar fracture.  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  He was seen in urgent care, he was ambulatory at the time. Sounds like he was diagnosed with a patellar fracture, referred to me, on exam he has 0/5 extension strength at the knee, I can palpate the 2 displaced ends of his patella. X-rays confirm the same.  Pain is well controlled with ibuprofen 800 and Vicodin. We will get a CT scan for preoperative planning, knee immobilizer, he will minimize weightbearing, ice, compress, elevate, and we need to get him in with one of our surgical colleagues.    ___________________________________________ Gwen Her. Dianah Field, M.D., ABFM., CAQSM. Primary Care and [place] Instructor of [place] of [facility] of Medicine

## 2021-10-10 NOTE — Assessment & Plan Note (Signed)
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  He was seen in urgent care, he was ambulatory at the time. Sounds like he was diagnosed with a patellar fracture, referred to me, on exam he has 0/5 extension strength at the knee, I can palpate the 2 displaced ends of his patella. X-rays confirm the same.  Pain is well controlled with ibuprofen 800 and Vicodin. We will get a CT scan for preoperative planning, knee immobilizer, he will minimize weightbearing, ice, compress, elevate, and we need to get him in with one of our surgical colleagues.

## 2021-10-11 ENCOUNTER — Other Ambulatory Visit: Payer: Self-pay

## 2021-10-11 ENCOUNTER — Ambulatory Visit: Payer: 59 | Admitting: Sports Medicine

## 2021-10-11 ENCOUNTER — Encounter (HOSPITAL_BASED_OUTPATIENT_CLINIC_OR_DEPARTMENT_OTHER): Payer: Self-pay | Admitting: Orthopedic Surgery

## 2021-10-13 ENCOUNTER — Encounter (HOSPITAL_BASED_OUTPATIENT_CLINIC_OR_DEPARTMENT_OTHER)
Admission: RE | Admit: 2021-10-13 | Discharge: 2021-10-13 | Disposition: A | Discharge status: Home / Self Care | Payer: 59 | Source: Ambulatory Visit | Attending: Orthopedic Surgery | Admitting: Orthopedic Surgery

## 2021-10-13 DIAGNOSIS — S76111A Strain of right quadriceps muscle, fascia and tendon, initial encounter: Secondary | ICD-10-CM | POA: Diagnosis present

## 2021-10-13 DIAGNOSIS — F17290 Nicotine dependence, other tobacco product, uncomplicated: Secondary | ICD-10-CM | POA: Diagnosis not present

## 2021-10-13 LAB — BASIC METABOLIC PANEL
Anion gap: 9 (ref 5–15)
BUN: 15 mg/dL (ref 6–20)
CO2: 27 mmol/L (ref 22–32)
Calcium: 9.5 mg/dL (ref 8.9–10.3)
Chloride: 103 mmol/L (ref 98–111)
Creatinine, Ser: 0.97 mg/dL (ref 0.61–1.24)
GFR, Estimated: >60 mL/min (ref >60)
Glucose, Bld: 139 mg/dL — ABNORMAL HIGH (ref 70–99)
Potassium: 4.1 mmol/L (ref 3.5–5.1)
Sodium: 139 mmol/L (ref 135–145)

## 2021-10-13 NOTE — Progress Notes (Signed)

## 2021-10-16 NOTE — H&P (Signed)
PREOPERATIVE H&P  Chief Complaint: right knee pain  HPI: Nicholas Pearson is a 50 y.o. male who is here for evaluation of a right knee injury.  He fell off of the back of a truck.  He works as a Administrator and injured the right knee.  He has had previous problems with the left knee, which had been unrelated to this.  He has what sounds like a patella tendon debridement with some removal of some loose bone chips off of the left knee.  His right knee has been significantly painful.  He has been using crutches and a knee immobilizer.  Unable to really bear weight on it.  It gives way.  He has had Hydrocodone and Ibuprofen.  X-rays and CT were taken showing a right patellar tendon disruption. This is significantly impairing activities of daily living.  He has elected for surgical management.   He is a smoker and I have encouraged him to quit.  He said he has not smoked much since the injury.  He is self-employed and works as a Administrator.     Past Medical History:  Diagnosis Date   Hypertension    Patellar tendon rupture    right   Past Surgical History:  Procedure Laterality Date   KNEE SURGERY     left   WISDOM TOOTH EXTRACTION     Social History   Socioeconomic History   Marital status: Married    Spouse name: Not on file   Number of children: Not on file   Years of education: Not on file   Highest education level: Not on file  Occupational History   Not on file  Tobacco Use   Smoking status: Every Day    Types: Cigars   Smokeless tobacco: Never   Tobacco comments:    Smokes approx 2-3/day  Substance and Sexual Activity   Alcohol use: Yes    Comment: social   Drug use: No   Sexual activity: Yes  Other Topics Concern   Not on file  Social History Narrative   Not on file   Social Determinants of Health   Financial Resource Strain: Not on file  Food Insecurity: Not on file  Transportation Needs: Not on file  Physical Activity: Not on file  Stress: Not on file   Social Connections: Not on file   History reviewed. No pertinent family history. Allergies  Allergen Reactions   Shellfish Allergy Shortness Of Breath, Itching and Swelling   Prior to Admission medications   Medication Sig Start Date End Date Taking? Authorizing Provider  amLODipine (NORVASC) 5 MG tablet Take 1 tablet (5 mg total) by mouth daily. 11/17/20  Yes Silverio Decamp, MD  HYDROcodone-acetaminophen (NORCO) 5-325 MG tablet Take 1 tablet by mouth 3 (three) times daily as needed for moderate pain. 10/06/21  Yes Eliezer Lofts, FNP  ibuprofen (ADVIL) 800 MG tablet Take 1 tablet (800 mg total) by mouth 3 (three) times daily. 10/06/21  Yes Eliezer Lofts, FNP  lisinopril-hydrochlorothiazide (ZESTORETIC) 20-25 MG tablet Take 1 tablet by mouth daily. 11/17/20  Yes Silverio Decamp, MD     Positive ROS: All other systems have been reviewed and were otherwise negative with the exception of those mentioned in the HPI and as above.  Physical Exam: General: Alert, no acute distress Cardiovascular: No pedal edema Respiratory: No cyanosis, no use of accessory musculature GI: No organomegaly, abdomen is soft and non-tender Skin: No lesions in the area of chief complaint Neurologic:  Sensation intact distally Psychiatric: Patient is competent for consent with normal mood and affect Lymphatic: No axillary or cervical lymphadenopathy  MUSCULOSKELETAL: On exam the right knee has a positive effusion.  He has an incompetent extensor mechanism.  Positive bruising.  Pain to palpation diffusely around the anterior knee.    X-rays and CAT scan demonstrate a patella tendon disruption.    Assessment: Right patella tendon disruption   Plan: Plan for Procedure(s): PATELLA TENDON REPAIR  The risks benefits and alternatives were discussed with the patient including but not limited to the risks of nonoperative treatment, versus surgical intervention including infection, bleeding, nerve  injury,  blood clots, cardiopulmonary complications, morbidity, mortality, among others, and they were willing to proceed.    Ventura Bruns, PA-C   10/16/2021 12:55 PM

## 2021-10-17 ENCOUNTER — Ambulatory Visit (HOSPITAL_BASED_OUTPATIENT_CLINIC_OR_DEPARTMENT_OTHER): Payer: Worker's Compensation | Admitting: Anesthesiology

## 2021-10-17 ENCOUNTER — Encounter (HOSPITAL_BASED_OUTPATIENT_CLINIC_OR_DEPARTMENT_OTHER): Payer: Self-pay | Admitting: Orthopedic Surgery

## 2021-10-17 ENCOUNTER — Ambulatory Visit (HOSPITAL_BASED_OUTPATIENT_CLINIC_OR_DEPARTMENT_OTHER)
Admission: RE | Admit: 2021-10-17 | Discharge: 2021-10-17 | Disposition: A | Discharge status: Home / Self Care | Payer: Worker's Compensation | Source: Ambulatory Visit | Attending: Orthopedic Surgery | Admitting: Orthopedic Surgery

## 2021-10-17 ENCOUNTER — Other Ambulatory Visit: Payer: Self-pay

## 2021-10-17 ENCOUNTER — Encounter (HOSPITAL_BASED_OUTPATIENT_CLINIC_OR_DEPARTMENT_OTHER)
Admission: RE | Disposition: A | Discharge status: Home / Self Care | Payer: Self-pay | Source: Ambulatory Visit | Attending: Orthopedic Surgery

## 2021-10-17 DIAGNOSIS — F17290 Nicotine dependence, other tobacco product, uncomplicated: Secondary | ICD-10-CM | POA: Insufficient documentation

## 2021-10-17 DIAGNOSIS — S76111A Strain of right quadriceps muscle, fascia and tendon, initial encounter: Secondary | ICD-10-CM | POA: Insufficient documentation

## 2021-10-17 DIAGNOSIS — I10 Essential (primary) hypertension: Secondary | ICD-10-CM

## 2021-10-17 HISTORY — DX: Strain of other muscle(s) and tendon(s) at lower leg level, unspecified leg, initial encounter: S86.819A

## 2021-10-17 HISTORY — PX: PATELLAR TENDON REPAIR: SHX737

## 2021-10-17 SURGERY — REPAIR, TENDON, PATELLAR
Anesthesia: General | Site: Knee | Laterality: Right

## 2021-10-17 MED ORDER — MEPERIDINE HCL 25 MG/ML IJ SOLN: 6.2500 mg | INTRAMUSCULAR | Status: DC | PRN

## 2021-10-17 MED ORDER — PHENYLEPHRINE 40 MCG/ML (10ML) SYRINGE FOR IV PUSH (FOR BLOOD PRESSURE SUPPORT)
PREFILLED_SYRINGE | INTRAVENOUS | Status: DC | PRN
  Administered 2021-10-17: 80 ug via INTRAVENOUS

## 2021-10-17 MED ORDER — POVIDONE-IODINE 10 % EX SWAB: 2.0000 "application " | Freq: Once | CUTANEOUS | Status: DC

## 2021-10-17 MED ORDER — MIDAZOLAM HCL 2 MG/2ML IJ SOLN
2.0000 mg | Freq: Once | INTRAMUSCULAR | Status: AC
  Administered 2021-10-17: 2 mg via INTRAVENOUS

## 2021-10-17 MED ORDER — FENTANYL CITRATE (PF) 100 MCG/2ML IJ SOLN
INTRAMUSCULAR | Status: AC
  Filled 2021-10-17: qty 2

## 2021-10-17 MED ORDER — ACETAMINOPHEN 500 MG PO TABS
ORAL_TABLET | ORAL | Status: AC
  Filled 2021-10-17: qty 2

## 2021-10-17 MED ORDER — HYDROMORPHONE HCL 1 MG/ML IJ SOLN: 0.2500 mg | INTRAMUSCULAR | Status: DC | PRN

## 2021-10-17 MED ORDER — LACTATED RINGERS IV SOLN: INTRAVENOUS | Status: DC

## 2021-10-17 MED ORDER — PROMETHAZINE HCL 25 MG/ML IJ SOLN: 6.2500 mg | INTRAMUSCULAR | Status: DC | PRN

## 2021-10-17 MED ORDER — OXYCODONE HCL 5 MG PO TABS: 5.0000 mg | ORAL_TABLET | Freq: Once | ORAL | Status: DC | PRN

## 2021-10-17 MED ORDER — AMISULPRIDE (ANTIEMETIC) 5 MG/2ML IV SOLN: 10.0000 mg | Freq: Once | INTRAVENOUS | Status: DC | PRN

## 2021-10-17 MED ORDER — OXYCODONE HCL 5 MG/5ML PO SOLN
5.0000 mg | Freq: Once | ORAL | Status: DC | PRN
Start: 2021-10-17 — End: 2021-10-17

## 2021-10-17 MED ORDER — HYDROCODONE-ACETAMINOPHEN 10-325 MG PO TABS: 1.0000 | ORAL_TABLET | Freq: Four times a day (QID) | ORAL | 0 refills | Status: DC | PRN

## 2021-10-17 MED ORDER — CEFAZOLIN SODIUM-DEXTROSE 2-4 GM/100ML-% IV SOLN
INTRAVENOUS | Status: AC
  Filled 2021-10-17: qty 100

## 2021-10-17 MED ORDER — LIDOCAINE 2% (20 MG/ML) 5 ML SYRINGE
INTRAMUSCULAR | Status: AC
  Filled 2021-10-17: qty 5

## 2021-10-17 MED ORDER — PROPOFOL 500 MG/50ML IV EMUL
INTRAVENOUS | Status: AC
  Filled 2021-10-17: qty 50

## 2021-10-17 MED ORDER — ACETAMINOPHEN 500 MG PO TABS
1000.0000 mg | ORAL_TABLET | Freq: Once | ORAL | Status: AC
  Administered 2021-10-17: 1000 mg via ORAL

## 2021-10-17 MED ORDER — POVIDONE-IODINE 7.5 % EX SOLN
Freq: Once | CUTANEOUS | Status: DC
  Filled 2021-10-17: qty 118

## 2021-10-17 MED ORDER — ROPIVACAINE HCL 5 MG/ML IJ SOLN
INTRAMUSCULAR | Status: DC | PRN
  Administered 2021-10-17: 30 mL via PERINEURAL

## 2021-10-17 MED ORDER — LIDOCAINE 2% (20 MG/ML) 5 ML SYRINGE
INTRAMUSCULAR | Status: DC | PRN
  Administered 2021-10-17: 60 mg via INTRAVENOUS

## 2021-10-17 MED ORDER — DEXAMETHASONE SODIUM PHOSPHATE 10 MG/ML IJ SOLN
INTRAMUSCULAR | Status: DC | PRN
  Administered 2021-10-17: 10 mg via INTRAVENOUS

## 2021-10-17 MED ORDER — MIDAZOLAM HCL 2 MG/2ML IJ SOLN
INTRAMUSCULAR | Status: AC
  Filled 2021-10-17: qty 2

## 2021-10-17 MED ORDER — ONDANSETRON HCL 4 MG PO TABS: 4.0000 mg | ORAL_TABLET | Freq: Three times a day (TID) | ORAL | 0 refills | Status: DC | PRN

## 2021-10-17 MED ORDER — ONDANSETRON HCL 4 MG/2ML IJ SOLN
INTRAMUSCULAR | Status: AC
  Filled 2021-10-17: qty 2

## 2021-10-17 MED ORDER — ONDANSETRON HCL 4 MG/2ML IJ SOLN
INTRAMUSCULAR | Status: DC | PRN
  Administered 2021-10-17: 4 mg via INTRAVENOUS

## 2021-10-17 MED ORDER — EPHEDRINE 5 MG/ML INJ
INTRAVENOUS | Status: AC
  Filled 2021-10-17: qty 5

## 2021-10-17 MED ORDER — PROPOFOL 10 MG/ML IV BOLUS
INTRAVENOUS | Status: DC | PRN
  Administered 2021-10-17: 200 mg via INTRAVENOUS

## 2021-10-17 MED ORDER — FENTANYL CITRATE (PF) 100 MCG/2ML IJ SOLN
INTRAMUSCULAR | Status: DC | PRN
  Administered 2021-10-17: 50 ug via INTRAVENOUS

## 2021-10-17 MED ORDER — FENTANYL CITRATE (PF) 100 MCG/2ML IJ SOLN
100.0000 ug | Freq: Once | INTRAMUSCULAR | Status: AC
  Administered 2021-10-17: 100 ug via INTRAVENOUS

## 2021-10-17 MED ORDER — DEXAMETHASONE SODIUM PHOSPHATE 10 MG/ML IJ SOLN
INTRAMUSCULAR | Status: AC
  Filled 2021-10-17: qty 1

## 2021-10-17 MED ORDER — 0.9 % SODIUM CHLORIDE (POUR BTL) OPTIME
TOPICAL | Status: DC | PRN
  Administered 2021-10-17: 150 mL

## 2021-10-17 MED ORDER — CEFAZOLIN SODIUM-DEXTROSE 2-4 GM/100ML-% IV SOLN
2.0000 g | INTRAVENOUS | Status: AC
  Administered 2021-10-17: 2 g via INTRAVENOUS

## 2021-10-17 SURGICAL SUPPLY — 74 items
APL PRP STRL LF DISP 70% ISPRP (MISCELLANEOUS) ×1
BAG DECANTER FOR FLEXI CONT (MISCELLANEOUS) IMPLANT
BANDAGE ESMARK 6X9 LF (GAUZE/BANDAGES/DRESSINGS) ×1 IMPLANT
BLADE SURG 15 STRL LF DISP TIS (BLADE) ×1 IMPLANT
BLADE SURG 15 STRL SS (BLADE) ×2
BNDG CMPR 9X6 STRL LF SNTH (GAUZE/BANDAGES/DRESSINGS) ×1
BNDG ELASTIC 6X5.8 VLCR STR LF (GAUZE/BANDAGES/DRESSINGS) ×6 IMPLANT
BNDG ESMARK 6X9 LF (GAUZE/BANDAGES/DRESSINGS) ×2
CANISTER SUCT 1200ML W/VALVE (MISCELLANEOUS) ×2 IMPLANT
CHLORAPREP W/TINT 26 (MISCELLANEOUS) ×2 IMPLANT
CUFF TOURN SGL QUICK 34 (TOURNIQUET CUFF)
CUFF TRNQT CYL 34X4.125X (TOURNIQUET CUFF) IMPLANT
DECANTER SPIKE VIAL GLASS SM (MISCELLANEOUS) IMPLANT
DRAPE EXTREMITY T 121X128X90 (DISPOSABLE) ×2 IMPLANT
DRAPE INCISE IOBAN 66X45 STRL (DRAPES) ×2 IMPLANT
DRAPE U-SHAPE 47X51 STRL (DRAPES) ×2 IMPLANT
DRSG PAD ABDOMINAL 8X10 ST (GAUZE/BANDAGES/DRESSINGS) ×8 IMPLANT
DRSG TEGADERM 2-3/8X2-3/4 SM (GAUZE/BANDAGES/DRESSINGS) ×2 IMPLANT
DURAPREP 26ML APPLICATOR (WOUND CARE) IMPLANT
ELECT REM PT RETURN 9FT ADLT (ELECTROSURGICAL) ×2
ELECTRODE REM PT RTRN 9FT ADLT (ELECTROSURGICAL) ×1 IMPLANT
GAUZE SPONGE 4X4 12PLY STRL (GAUZE/BANDAGES/DRESSINGS) ×2 IMPLANT
GAUZE XEROFORM 1X8 LF (GAUZE/BANDAGES/DRESSINGS) IMPLANT
GLOVE SRG 8 PF TXTR STRL LF DI (GLOVE) ×2 IMPLANT
GLOVE SURG ENC MOIS LTX SZ7 (GLOVE) ×2 IMPLANT
GLOVE SURG ORTHO LTX SZ7.5 (GLOVE) ×2 IMPLANT
GLOVE SURG ORTHO LTX SZ8 (GLOVE) ×2 IMPLANT
GLOVE SURG POLYISO LF SZ6.5 (GLOVE) ×2 IMPLANT
GLOVE SURG UNDER POLY LF SZ6.5 (GLOVE) ×2 IMPLANT
GLOVE SURG UNDER POLY LF SZ7 (GLOVE) ×4 IMPLANT
GLOVE SURG UNDER POLY LF SZ8 (GLOVE) ×4
GOWN STRL REUS W/ TWL LRG LVL3 (GOWN DISPOSABLE) ×1 IMPLANT
GOWN STRL REUS W/ TWL XL LVL3 (GOWN DISPOSABLE) ×2 IMPLANT
GOWN STRL REUS W/TWL LRG LVL3 (GOWN DISPOSABLE) ×2
GOWN STRL REUS W/TWL XL LVL3 (GOWN DISPOSABLE) ×4
IMMOBILIZER KNEE 24 THIGH 36 (MISCELLANEOUS) IMPLANT
IMMOBILIZER KNEE 24 UNIV (MISCELLANEOUS)
NDL SUT 6 .5 CRC .975X.05 MAYO (NEEDLE) IMPLANT
NEEDLE KEITH (NEEDLE) ×2 IMPLANT
NEEDLE MAYO TAPER (NEEDLE)
NEEDLE MAYO TROCAR (NEEDLE) IMPLANT
NS IRRIG 1000ML POUR BTL (IV SOLUTION) ×2 IMPLANT
PACK ARTHROSCOPY DSU (CUSTOM PROCEDURE TRAY) ×2 IMPLANT
PACK BASIN DAY SURGERY FS (CUSTOM PROCEDURE TRAY) ×2 IMPLANT
PADDING CAST COTTON 6X4 STRL (CAST SUPPLIES) ×6 IMPLANT
PASSER SUT SWANSON 36MM LOOP (INSTRUMENTS) IMPLANT
PENCIL SMOKE EVACUATOR (MISCELLANEOUS) ×2 IMPLANT
SHEET MEDIUM DRAPE 40X70 STRL (DRAPES) ×2 IMPLANT
SLEEVE SCD COMPRESS KNEE MED (STOCKING) ×2 IMPLANT
SPLINT FAST PLASTER 5X30 (CAST SUPPLIES) ×30
SPLINT PLASTER CAST FAST 5X30 (CAST SUPPLIES) ×30 IMPLANT
SPONGE T-LAP 18X18 ~~LOC~~+RFID (SPONGE) ×2 IMPLANT
STAPLER VISISTAT (STAPLE) IMPLANT
STAPLER VISISTAT 35W (STAPLE) IMPLANT
STRIP CLOSURE SKIN 1/2X4 (GAUZE/BANDAGES/DRESSINGS) ×2 IMPLANT
SUCTION FRAZIER HANDLE 10FR (MISCELLANEOUS) ×1
SUCTION TUBE FRAZIER 10FR DISP (MISCELLANEOUS) ×1 IMPLANT
SUT FIBERWIRE #2 38 T-5 BLUE (SUTURE)
SUT MNCRL AB 3-0 PS2 18 (SUTURE) ×2 IMPLANT
SUT MNCRL AB 4-0 PS2 18 (SUTURE) IMPLANT
SUT VIC AB 0 CT1 27 (SUTURE) ×8
SUT VIC AB 0 CT1 27XBRD ANBCTR (SUTURE) ×4 IMPLANT
SUT VIC AB 2-0 SH 27 (SUTURE) ×4
SUT VIC AB 2-0 SH 27XBRD (SUTURE) ×2 IMPLANT
SUT VICRYL 0 SH 27 (SUTURE) IMPLANT
SUT VICRYL 3-0 CR8 SH (SUTURE) IMPLANT
SUTURE FIBERWR #2 38 T-5 BLUE (SUTURE) IMPLANT
SUTURE TAPE 1.3 40 TPR END (SUTURE) ×2 IMPLANT
SUTURETAPE 1.3 40 TPR END (SUTURE) ×4
SYR BULB EAR ULCER 3OZ GRN STR (SYRINGE) ×2 IMPLANT
TOWEL GREEN STERILE FF (TOWEL DISPOSABLE) ×2 IMPLANT
TUBE CONNECTING 20X1/4 (TUBING) ×2 IMPLANT
UNDERPAD 30X36 HEAVY ABSORB (UNDERPADS AND DIAPERS) ×2 IMPLANT
YANKAUER SUCT BULB TIP NO VENT (SUCTIONS) ×2 IMPLANT

## 2021-10-17 NOTE — Discharge Instructions (Addendum)
Diet: As you were doing prior to hospitalization   Shower:  leave the splint in place and keep the splint dry with a plastic bag.  Dressing: just leave the splint in place and we will change your bandages during your first follow-up appointment.  There are sticky tapes (steri-strips) on your wounds and all the stitches are absorbable.  Leave the steri-strips in place when changing your dressings, they will peel off with time, usually 2-3 weeks.  Activity:  Increase activity slowly as tolerated, but follow the weight bearing instructions below.  The rules on driving is that you can not be taking narcotics while you drive, and you must feel in control of the vehicle.    Weight Bearing:   No bearing weight on right leg.  To prevent constipation: you may use a stool softener such as -  Colace (over the counter) 100 mg by mouth twice a day  Drink plenty of fluids (prune juice may be helpful) and high fiber foods Miralax (over the counter) for constipation as needed.    Itching:  If you experience itching with your medications, try taking only a single pain pill, or even half a pain pill at a time.  You may take up to 10 pain pills per day, and you can also use benadryl over the counter for itching or also to help with sleep.   Precautions:  If you experience chest pain or shortness of breath - call 911 immediately for transfer to the hospital emergency department!!  If you develop a fever greater that 101 F, purulent drainage from wound, increased redness or drainage from wound, or calf pain -- Call the office at [phone]                                                Follow- Up Appointment:  Please call for an appointment to be seen in 2 weeks [facility] - [phone]  No tylenol until after 6:15pm tonight, if needed.   Post Anesthesia Home Care Instructions  Activity: Get plenty of rest for the remainder of the day. A responsible individual must stay with you for 24 hours following the  procedure.  For the next 24 hours, DO NOT: -Drive a car -Paediatric nurse -Drink alcoholic beverages -Take any medication unless instructed by your physician -Make any legal decisions or sign important papers.  Meals: Start with liquid foods such as gelatin or soup. Progress to regular foods as tolerated. Avoid greasy, spicy, heavy foods. If nausea and/or vomiting occur, drink only clear liquids until the nausea and/or vomiting subsides. Call your physician if vomiting continues.  Special Instructions/Symptoms: Your throat may feel dry or sore from the anesthesia or the breathing tube placed in your throat during surgery. If this causes discomfort, gargle with warm salt water. The discomfort should disappear within 24 hours.  If you had a scopolamine patch placed behind your ear for the management of post- operative nausea and/or vomiting:  1. The medication in the patch is effective for 72 hours, after which it should be removed.  Wrap patch in a tissue and discard in the trash. Wash hands thoroughly with soap and water. 2. You may remove the patch earlier than 72 hours if you experience unpleasant side effects which may include dry mouth, dizziness or visual disturbances. 3. Avoid touching the patch. Wash your hands with soap  and water after contact with the patch.    Regional Anesthesia Blocks  1. Numbness or the inability to move the "blocked" extremity may last from 3-48 hours after placement. The length of time depends on the medication injected and your individual response to the medication. If the numbness is not going away after 48 hours, call your surgeon.  2. The extremity that is blocked will need to be protected until the numbness is gone and the  Strength has returned. Because you cannot feel it, you will need to take extra care to avoid injury. Because it may be weak, you may have difficulty moving it or using it. You may not know what position it is in without looking at it  while the block is in effect.  3. For blocks in the legs and feet, returning to weight bearing and walking needs to be done carefully. You will need to wait until the numbness is entirely gone and the strength has returned. You should be able to move your leg and foot normally before you try and bear weight or walk. You will need someone to be with you when you first try to ensure you do not fall and possibly risk injury.  4. Bruising and tenderness at the needle site are common side effects and will resolve in a few days.  5. Persistent numbness or new problems with movement should be communicated to the surgeon or the [place] [phone] [place] [phone]).

## 2021-10-17 NOTE — Interval H&P Note (Signed)
History and Physical Interval Note:  10/17/2021 1:11 PM  Nicholas Pearson  has presented today for surgery, with the diagnosis of RIGHT KNEE PATELLA TENDON RUPTURE.  The various methods of treatment have been discussed with the patient and family. After consideration of risks, benefits and other options for treatment, the patient has consented to  Procedure(s): PATELLA TENDON REPAIR (Right) as a surgical intervention.  The patient's history has been reviewed, patient examined, no change in status, stable for surgery.  I have reviewed the patient's chart and labs.  Questions were answered to the patient's satisfaction.     [name]

## 2021-10-17 NOTE — Progress Notes (Signed)
Assisted Dr. Sabra Heck with right, femoral block. Side rails up, monitors on throughout procedure. See vital signs in flow sheet. Tolerated Procedure well.

## 2021-10-17 NOTE — Anesthesia Procedure Notes (Signed)
Procedure Name: LMA Insertion Date/Time: 10/17/2021 1:58 PM Performed by: Tawni Millers, CRNA Pre-anesthesia Checklist: Patient identified, Emergency Drugs available, Suction available and Patient being monitored Patient Re-evaluated:Patient Re-evaluated prior to induction Oxygen Delivery Method: Circle system utilized Preoxygenation: Pre-oxygenation with 100% oxygen Induction Type: IV induction Ventilation: Mask ventilation without difficulty LMA: LMA inserted LMA Size: 5.0 Number of attempts: 1 Airway Equipment and Method: Bite block Placement Confirmation: positive ETCO2 Tube secured with: Tape Dental Injury: Teeth and Oropharynx as per pre-operative assessment

## 2021-10-17 NOTE — Anesthesia Postprocedure Evaluation (Signed)
Anesthesia Post Note  Patient: Nicholas Pearson  Procedure(s) Performed: RIGHT PATELLA TENDON REPAIR (Right: Knee)     Patient location during evaluation: PACU Anesthesia Type: General Level of consciousness: awake and alert Pain management: pain level controlled Vital Signs Assessment: post-procedure vital signs reviewed and stable Respiratory status: spontaneous breathing, nonlabored ventilation and respiratory function stable Cardiovascular status: blood pressure returned to baseline and stable Postop Assessment: no apparent nausea or vomiting Anesthetic complications: no   No notable events documented.  Last Vitals:  Vitals:   10/17/21 1545 10/17/21 1600  BP: (!) 125/91 (!) 140/102  Pulse: 85 93  Resp: 15 14  Temp:    SpO2: 98% 95%    Last Pain:  Vitals:   10/17/21 1600  TempSrc:   PainSc: 0-No pain        RLE Motor Response: Purposeful movement;Responds to commands (10/17/21 1600) RLE Sensation: Decreased (10/17/21 1600)      Lynda Rainwater

## 2021-10-17 NOTE — Op Note (Signed)
10/17/2021  2:54 PM  PATIENT:  Genia Plants    PRE-OPERATIVE DIAGNOSIS:  RIGHT KNEE PATELLAR TENDON RUPTURE  POST-OPERATIVE DIAGNOSIS:  Same  PROCEDURE:  RIGHT PATELLAR TENDON REPAIR with medial and lateral retinacular repairs  SURGEON:  [name], MD  PHYSICIAN ASSISTANT: Merlene Pulling, PA-C, present and scrubbed throughout the case, critical for completion in a timely fashion, and for retraction, instrumentation, and closure.  ANESTHESIA:   General  PREOPERATIVE INDICATIONS:  ORLANDO DEVEREUX is a  50 y.o. male with a diagnosis of RIGHT KNEE PATELLA TENDON RUPTURE who failed conservative measures and elected for surgical management.    The risks benefits and alternatives were discussed with the patient preoperatively including but not limited to the risks of infection, bleeding, nerve injury, cardiopulmonary complications, the need for revision surgery, among others, and the patient was willing to proceed.  ESTIMATED BLOOD LOSS: 75 mL  OPERATIVE IMPLANTS: Suture tape x2 brought through drill holes in the patella with 0 Vicryl for the medial and lateral retinacular repairs.  OPERATIVE FINDINGS: Complete patellar tendon disruption, the bone was quite hard.  OPERATIVE PROCEDURE: The patient was brought to the operating room and placed in the supine position.  General anesthesia was administered.  The right lower extremity was prepped and draped in usual sterile fashion using ChloraPrep given his shellfish allergy.  Timeout was performed, the leg was elevated and exsanguinated and a tourniquet was inflated.  Incision was made anterior to the patella, and the patella tendon disruption was identified.  This was cleaned.  The surface prepared for reimplantation.  I placed a total of two #2 suture tapes up and down the tendon, and then brought them through drill holes holding the underside of the patella with my finger in order to ensure that I did not go into the articular surface.   These were brought superiorly, and then tied over their respective bone bridges.  I used a 2-0 Vicryl to assist with the passage of the sutures through a [place] needle.  There was a small bony fragment within the patellar tendon itself which I did not excise, as it would have caused shortening of the tendon length, and did not appear to be contributing to significant pathology.  The tendon was well opposed nicely to the raw surface of the patella, and I repaired the medial and lateral retinacular repairs with Vicryl.  Subcutaneous Vicryl for was applied followed by routine closure with the skin with Steri-Strips and sterile gauze.  A long-leg splint was applied.  He was awakened and returned to the PACU in stable and satisfactory condition.  There were no complications and he tolerated the procedure well.

## 2021-10-17 NOTE — Transfer of Care (Signed)
Immediate Anesthesia Transfer of Care Note  Patient: Nicholas Pearson  Procedure(s) Performed: RIGHT PATELLA TENDON REPAIR (Right: Knee)  Patient Location: PACU  Anesthesia Type:GA combined with regional for post-op pain  Level of Consciousness: sedated  Airway & Oxygen Therapy: Patient Spontanous Breathing and Patient connected to face mask oxygen  Post-op Assessment: Report given to RN and Post -op Vital signs reviewed and stable  Post vital signs: Reviewed and stable  Last Vitals:  Vitals Value Taken Time  BP 121/82 10/17/21 1537  Temp    Pulse 89 10/17/21 1538  Resp 14 10/17/21 1538  SpO2 97 % 10/17/21 1538  Vitals shown include unvalidated device data.  Last Pain:  Vitals:   10/17/21 1218  TempSrc: Oral  PainSc:       Patients Stated Pain Goal: 3 (78/29/56 2130)  Complications: No notable events documented.

## 2021-10-17 NOTE — Anesthesia Preprocedure Evaluation (Signed)
Anesthesia Evaluation  Patient identified by MRN, date of birth, ID band Patient awake    Reviewed: Allergy & Precautions, NPO status , Patient's Chart, lab work & pertinent test results  Airway Mallampati: II  TM Distance: >3 FB Neck ROM: Full    Dental no notable dental hx.    Pulmonary neg pulmonary ROS, Current Smoker and Patient abstained from smoking.,    Pulmonary exam normal breath sounds clear to auscultation       Cardiovascular hypertension, Pt. on medications negative cardio ROS Normal cardiovascular exam Rhythm:Regular Rate:Normal     Neuro/Psych negative neurological ROS  negative psych ROS   GI/Hepatic negative GI ROS, Neg liver ROS,   Endo/Other  negative endocrine ROS  Renal/GU negative Renal ROS  negative genitourinary   Musculoskeletal negative musculoskeletal ROS (+)   Abdominal   Peds negative pediatric ROS (+)  Hematology negative hematology ROS (+)   Anesthesia Other Findings   Reproductive/Obstetrics negative OB ROS                             Anesthesia Physical Anesthesia Plan  ASA: 2  Anesthesia Plan: General   Post-op Pain Management:  Regional for Post-op pain   Induction: Intravenous  PONV Risk Score and Plan: 1 and Ondansetron and Treatment may vary due to age or medical condition  Airway Management Planned: LMA  Additional Equipment:   Intra-op Plan:   Post-operative Plan: Extubation in OR  Informed Consent: I have reviewed the patients History and Physical, chart, labs and discussed the procedure including the risks, benefits and alternatives for the proposed anesthesia with the patient or authorized representative who has indicated his/her understanding and acceptance.     Dental advisory given  Plan Discussed with: CRNA  Anesthesia Plan Comments:         Anesthesia Quick Evaluation

## 2021-10-17 NOTE — Anesthesia Procedure Notes (Signed)
Anesthesia Regional Block: Femoral nerve block   Pre-Anesthetic Checklist: , timeout performed,  Correct Patient, Correct Site, Correct Laterality,  Correct Procedure, Correct Position, site marked,  Risks and benefits discussed,  Surgical consent,  Pre-op evaluation,  At surgeon's request and post-op pain management  Laterality: Right  Prep: chloraprep       Needles:  Injection technique: Single-shot  Needle Type: Stimiplex     Needle Length: 9cm  Needle Gauge: 21     Additional Needles:   Procedures:,,,, ultrasound used (permanent image in chart),,    Narrative:  Start time: 10/17/2021 12:48 PM End time: 10/17/2021 12:53 PM Injection made incrementally with aspirations every 5 mL.  Performed by: Personally  Anesthesiologist: Lynda Rainwater, MD

## 2021-10-18 ENCOUNTER — Encounter (HOSPITAL_BASED_OUTPATIENT_CLINIC_OR_DEPARTMENT_OTHER): Payer: Self-pay | Admitting: Orthopedic Surgery

## 2021-11-27 ENCOUNTER — Encounter: Payer: Self-pay | Admitting: Sports Medicine

## 2021-11-30 ENCOUNTER — Other Ambulatory Visit: Payer: Self-pay

## 2021-11-30 ENCOUNTER — Encounter: Payer: Self-pay | Admitting: Rehabilitative and Restorative Service Providers"

## 2021-11-30 ENCOUNTER — Other Ambulatory Visit: Payer: Self-pay | Admitting: Sports Medicine

## 2021-11-30 ENCOUNTER — Ambulatory Visit (INDEPENDENT_AMBULATORY_CARE_PROVIDER_SITE_OTHER): Payer: 59 | Admitting: Rehabilitative and Restorative Service Providers"

## 2021-11-30 DIAGNOSIS — M25661 Stiffness of right knee, not elsewhere classified: Secondary | ICD-10-CM | POA: Diagnosis not present

## 2021-11-30 DIAGNOSIS — R29898 Other symptoms and signs involving the musculoskeletal system: Secondary | ICD-10-CM

## 2021-11-30 DIAGNOSIS — M6281 Muscle weakness (generalized): Secondary | ICD-10-CM | POA: Diagnosis not present

## 2021-11-30 DIAGNOSIS — I10 Essential (primary) hypertension: Secondary | ICD-10-CM

## 2021-11-30 DIAGNOSIS — R2689 Other abnormalities of gait and mobility: Secondary | ICD-10-CM

## 2021-11-30 NOTE — Patient Instructions (Signed)
Aquatic Therapy: What to Expect!  Where:  [facility] [address] [place], [place] [address] [phone]           How to Prepare:  If you require assistance with dressing, with transportation (ie: wheel chair), or toileting, a caregiver must attend the entire session with you (unless your primary therapists feels this is not necessary).   If there is thunder during your appointment, you will be asked to leave pool area. You have the option to finish your session in the physical therapy area near the gym. Masks in the pool area are optional. Your face will remain dry during your session, so you are welcome to keep your mask on, if desired. You will be spaced at least 6 feet from other aquatic patients.  Please bring your own swim towel to dry off with.   There are Men's and Women's locker rooms with showers, as well as gender neutral bathrooms in the pool area.  Please arrive IN YOUR SUIT and a few minutes prior to your appointment - this helps to avoid delays in starting your session. Head to the pool and await your appointment on the bench on the pool deck. Please make sure to attend to any toileting needs prior to entering the pool. Once on the pool deck your therapist will ask you to sign the Patient  Consent and Assignment of Benefits form. Your therapist may take your blood pressure prior to, during and after your session if indicated. We usually try and create a home exercise program based on activities we do in the pool. Some patients do not want to or do not have the ability to participate in an aquatic home program - this is not a barrier in any way to you participating in aquatic therapy as part of your current therapy plan!  Appointments:  All sessions are 45 minutes  About the pool: Entering the pool: Your therapist will assist you if needed; there are two ways to enter the pool - stairs or a mechanical lift. Your therapist will determine  the most appropriate way for you. Water temperature is usually around 91-95.  There is a lap pool with a temperature around 84 There may be other therapists and patients in the pool at the same time.   Contact Info:            To cancel appointment, please call [facility] at [phone] If you are running late, please call [facility] at [phone]                  [place] [facility] Exercise Resources   Community Programs Location and contact info description of services cost  [facility] [employer] [facility] [address] [place], [place] [phone]  Aquatics, silver sneakers classes, yoga, exercise equipment $18/month for 55+ $25/month individual $40-$60/month family  [facility] [address] [place], [place] [phone] Transportation to appointments. Activities (stretching, dancing, Tai Chi)  Mostly free  [facility] [address] [place], [place] [phone]  [employer] [facility] Aqu[facility] Fee based  [employer]wise exercise program Various locations [phone]  [place] exercise free  [place] [facility] Various locations [phone] Yoga, Tai Chi, Step Aerobics, Bocce, Table tennis, chair volleyball, petanque  free  [facility] [address] [place], [place]  [phone] Various activities (drum exercise, yoga, line dancing, etc)  free    

## 2021-11-30 NOTE — Therapy (Signed)
[place]  [place], [place], [address] Phone: [phone]   Fax:  [phone]  Physical Therapy Evaluation  Patient Details  Name: Nicholas Pearson MRN: [id number] Date of Birth: Apr 16, 1971 Referring Provider (PT): Dr Marchia Bond   Encounter Date: 11/30/2021   PT End of Session - 11/30/21 1155     Visit Number 1    Number of Visits 20    Date for PT Re-Evaluation 02/08/22    PT Start Time 1022    PT Stop Time 1100    PT Time Calculation (min) 38 min    Activity Tolerance Patient tolerated treatment well             Past Medical History:  Diagnosis Date   Hypertension    Patellar tendon rupture    right    Past Surgical History:  Procedure Laterality Date   KNEE SURGERY     left   PATELLAR TENDON REPAIR Right 10/17/2021   Procedure: RIGHT PATELLA TENDON REPAIR;  Surgeon: Marchia Bond, MD;  Location: [place];  Service: Orthopedics;  Laterality: Right;   WISDOM TOOTH EXTRACTION      There were no vitals filed for this visit.    Subjective Assessment - 11/30/21 1028     Subjective Patient reports tjat he feel off his truck 10/06/21 landing on Rt knee sustaining fx to patella. He underwent surgical repair for patela tendon 10/17/21. Casted until 11/27/21 now in long leg brce locked at 30 deg flexion.    Pertinent History HTN' bone spur Lt knee 1995    Patient Stated Goals get back walking to return to work    Currently in Pain? No/denies    Pain Score 0-No pain    Pain Location Knee    Pain Orientation Right                [facility] PT Assessment - 11/30/21 0001       Assessment   Medical Diagnosis Rt patella tendon repair    Referring Provider (PT) Dr Marchia Bond    Onset Date/Surgical Date 10/06/21   surgery 10/17/21   Hand Dominance Right    Next MD Visit 12/25/21    Prior Therapy during college basketball      Precautions   Precautions Other (comment)     Precaution Comments post op precautiong per protocol    Required Braces or Orthoses Other Brace/Splint    Other Brace/Splint long leg brace locked 0-3o degrees flexion      Restrictions   Weight Bearing Restrictions Yes      Balance Screen   Has the patient fallen in the past 6 months Yes    How many times? 1    Has the patient had a decrease in activity level because of a fear of falling?  Yes    Is the patient reluctant to leave their home because of a fear of falling?  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  Leisure yard work; Office manager; sports with kids      Observation/Other Assessments   Focus on Therapeutic Outcomes (FOTO)  36      Sensation   Additional Comments WFL's per pt report      AROM   Overall AROM Comments AROM WNL's bilat UE's and Lt LE - limited to 0 deg entension, 30 deg flexion Rt knee in brace      Strength   Overall Strength Comments bilat UE's; Lt LE WNL's - strength not tested Rt LE      Transfers   Comments independent in transfers      Ambulation/Gait   Ambulation/Gait Yes    Ambulation/Gait Assistance 5: Supervision    Ambulation Distance (Feet) 80 Feet    Assistive device Straight cane    Ambulation Surface Level    Gait Comments patient presents using cane in Rt UE - insturced and practiced gait with cane in Lt UE including stairs using railing bilat                        Objective measurements completed on examination: See above findings.                PT Education - 11/30/21 1154     Education Details POC; aquatic therapy;  gait training with straight cane    Person(s) Educated Patient;Spouse    Methods Explanation;Demonstration;Tactile cues;Verbal cues;Handout    Comprehension Verbalized understanding;Returned demonstration;Verbal cues required;Tactile cues required              PT Short Term Goals - 11/30/21 1238       PT SHORT TERM GOAL #1   Title Evaluation of ROM/strength Rt LE as indicated    Time 4    Period Weeks    Status New    Target Date 12/28/21      PT SHORT TERM GOAL #2   Title Increase AROM Rt knee to 0 deg extension; 90 or greater flexion    Time 4    Period Weeks    Status New    Target Date 12/28/21      PT SHORT TERM GOAL #3   Title Independent with gait with least restrictive assistive device - weight bearing as allowed by MD    Time 4    Period Weeks    Status New    Target Date 12/28/21      PT SHORT TERM GOAL #4   Title Independent in initial HEP including aquatic exercises as indicated    Time 6    Period Weeks    Status New    Target Date 01/11/22               PT Long Term Goals - 11/30/21 1240       PT LONG TERM GOAL #1   Title Increase AROM Rt knee to WFL's allowing patient to bend knee for all transfers and transitional movements necessary for return to work(RTW)    Time 12    Period Weeks    Status New    Target Date 02/08/22      PT LONG TERM GOAL #2   Title Rt LE strength 4+/5 to 5/5    Time 12    Period Weeks    Status New    Target Date 02/08/22      PT LONG TERM GOAL #3   Title Independent ambulation without assistive device with good gait pattern allowing patient to  ambulate for community and work distances for RTW    Time 12    Period Weeks    Status New    Target Date 02/08/22      PT LONG TERM GOAL #4   Title Independent in HEP including aquatic program prior to discharge    Time 12    Period Weeks    Status New    Target Date 02/08/22      PT LONG TERM GOAL #5   Title Improve functional limitation score to 64     Time 12    Period Weeks    Status New    Target Date 02/08/22                    Plan - 11/30/21 1200     Clinical Impression Statement Patient presents s/p fracture Rt patella 10/06/21; patellar tendon repair 10/17/21. He is in long leg brace locked at 0 deg extension to 30 deg flexion. Patient is PWP per pt report. Ambulatory with straight cane - corrected side he is using the cane on and practiced gait with wt bearing per pt tolerance. Patient has limited Rt LE ROM, strength, functional abilities including gait, stairs, work related tasks. 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 He will benefit from PT to address defecits identified and retuen to normal functional activities including work tasks.    Stability/Clinical Decision Making Stable/Uncomplicated    Clinical Decision Making Low    Rehab Potential Good    PT Frequency 2x / week    PT Duration 8 weeks    PT Treatment/Interventions ADLs/Self Care Home Management;Aquatic Therapy;Cryotherapy;Electrical Stimulation;Iontophoresis 57m/ml Dexamethasone;Moist Heat;Ultrasound;Gait training;Stair training;Functional mobility training;Therapeutic activities;Therapeutic exercise;Balance training;Neuromuscular re-education;Patient/family education;Manual techniques;Passive range of motion;Dry needling;Taping;Vasopneumatic Device    PT Next Visit Plan progress with ther exercise per protocol as indicated to work on AROM, strength, gait, transfers; modalities and manual work as indicated    COncologistwith Plan of Care Patient;Family member/caregiver    Family Member Consulted wife - AMuseum/gallery conservator            Patient will benefit from skilled therapeutic intervention in order to improve the following deficits and impairments:  Abnormal gait, Decreased range of motion, Decreased activity tolerance, Decreased balance, Impaired flexibility, Decreased mobility, Decreased strength,  Increased edema, Postural dysfunction  Visit Diagnosis: Avulsion of right patellar tendon, sequela  Other symptoms and signs involving the musculoskeletal system  Muscle weakness (generalized)  Other abnormalities of gait and mobility     Problem List Patient Active Problem List   Diagnosis Date Noted   Fracture of patella, right, closed 10/10/2021   Hypertriglyceridemia 05/18/2014   Prediabetes 05/18/2014   Annual physical exam 05/17/2014   LPRD (laryngopharyngeal reflux disease) 04/10/2013   Smoker 12/04/2012   Hypertension 12/04/2012   Left L5 lumbar radiculopathy 11/04/2012    Shwanda Soltis PNilda Simmer PT, MPH  11/30/2021, 1:05 PM  [facility] [address]Phone: [phone]  Fax:  [phone] Name: Nicholas KARGEMRN: [id number] of Birth: 5Jun 03, 1972

## 2021-12-01 ENCOUNTER — Ambulatory Visit (INDEPENDENT_AMBULATORY_CARE_PROVIDER_SITE_OTHER): Payer: 59 | Admitting: Rehabilitative and Restorative Service Providers"

## 2021-12-01 ENCOUNTER — Encounter: Payer: Self-pay | Admitting: Rehabilitative and Restorative Service Providers"

## 2021-12-01 DIAGNOSIS — M6281 Muscle weakness (generalized): Secondary | ICD-10-CM

## 2021-12-01 DIAGNOSIS — R2689 Other abnormalities of gait and mobility: Secondary | ICD-10-CM | POA: Diagnosis not present

## 2021-12-01 DIAGNOSIS — R29898 Other symptoms and signs involving the musculoskeletal system: Secondary | ICD-10-CM

## 2021-12-01 DIAGNOSIS — M25661 Stiffness of right knee, not elsewhere classified: Secondary | ICD-10-CM

## 2021-12-01 NOTE — Therapy (Signed)
[place], [place], [address] Phone: [phone]   Fax:  [phone]  Physical Therapy Treatment  Patient Details  Name: Nicholas Pearson MRN: [id number] Date of Birth: Dec 05, 1971 Referring Provider (PT): Dr Marchia Bond   Encounter Date: 12/01/2021   PT End of Session - 12/01/21 1104     Visit Number 2    Number of Visits 20    Date for PT Re-Evaluation 02/08/22    PT Start Time 1103    PT Stop Time 1145    PT Time Calculation (min) 42 min    Activity Tolerance Patient tolerated treatment well             Past Medical History:  Diagnosis Date   Hypertension    Patellar tendon rupture    right    Past Surgical History:  Procedure Laterality Date   KNEE SURGERY     left   PATELLAR TENDON REPAIR Right 10/17/2021   Procedure: RIGHT PATELLA TENDON REPAIR;  Surgeon: Marchia Bond, MD;  Location: [place];  Service: Orthopedics;  Laterality: Right;   WISDOM TOOTH EXTRACTION      There were no vitals filed for this visit.   Subjective Assessment - 12/01/21 1106     Subjective Getting used to the cane on the Lt side. Ready to get going with the exercises and get the leg working again.    Pain Score 0-No pain    Pain Location Knee                               [facility] Adult PT Treatment/Exercise - 12/01/21 0001       Exercises   Exercises --   prone prop 30 ec x 1 rep; prone press up through avialble range 2-3 sec hold x 3 reps - for HEP     Knee/Hip Exercises: Stretches   Passive Hamstring Stretch Right;3 reps;30 seconds    Passive Hamstring Stretch Limitations supine with strap    ITB Stretch Right;3 reps;30 seconds    ITB Stretch Limitations supine with strap    Gastroc Stretch Right;3 reps;30 seconds    Gastroc Stretch Limitations long sitting using strap      Knee/Hip Exercises: Supine   Quad Sets Strengthening;Right;1 set;10 reps     Quad Sets Limitations 50% contraction initially    Straight Leg Raises Strengthening;Right;1 set;10 reps    Straight Leg Raises Limitations 5 sec hold      Knee/Hip Exercises: Sidelying   Hip ABduction Strengthening;Right;1 set;10 reps    Hip ABduction Limitations 5 sec hold    Hip ADduction Strengthening;Right;1 set;10 reps    Hip ADduction Limitations 5 sec hold      Knee/Hip Exercises: Prone   Hip Extension Strengthening;Right;1 set;10 reps    Hip Extension Limitations 5 sec hold    Other Prone Exercises glut set 10 sec hold x 10 reps    Other Prone Exercises alternate arm/leg raise 2-3 sec hold x 10 each side alternating                     PT Education - 12/01/21 1133     Education Details HEP    Person(s) Educated Patient    Methods Explanation;Demonstration;Tactile cues;Verbal cues;Handout    Comprehension Verbalized understanding;Returned demonstration;Verbal cues required;Tactile cues required  PT Short Term Goals - 11/30/21 1238       PT SHORT TERM GOAL #1   Title Evaluation of ROM/strength Rt LE as indicated    Time 4    Period Weeks    Status New    Target Date 12/28/21      PT SHORT TERM GOAL #2   Title Increase AROM Rt knee to 0 deg extension; 90 or greater flexion    Time 4    Period Weeks    Status New    Target Date 12/28/21      PT SHORT TERM GOAL #3   Title Independent with gait with least restrictive assistive device - weight bearing as allowed by MD    Time 4    Period Weeks    Status New    Target Date 12/28/21      PT SHORT TERM GOAL #4   Title Independent in initial HEP including aquatic exercises as indicated    Time 6    Period Weeks    Status New    Target Date 01/11/22               PT Long Term Goals - 11/30/21 1240       PT LONG TERM GOAL #1   Title Increase AROM Rt knee to WFL's allowing patient to bend knee for all transfers and transitional movements necessary for return to work(RTW)     Time 12    Period Weeks    Status New    Target Date 02/08/22      PT LONG TERM GOAL #2   Title Rt LE strength 4+/5 to 5/5    Time 12    Period Weeks    Status New    Target Date 02/08/22      PT LONG TERM GOAL #3   Title Independent ambulation without assistive device with good gait pattern allowing patient to ambulate for community and work distances for RTW    Time 12    Period Weeks    Status New    Target Date 02/08/22      PT LONG TERM GOAL #4   Title Independent in HEP including aquatic program prior to discharge    Time 12    Period Weeks    Status New    Target Date 02/08/22      PT LONG TERM GOAL #5   Title Improve functional limitation score to 64    Time 12    Period Weeks    Status New    Target Date 02/08/22                   Plan - 12/01/21 1146     Clinical Impression Statement Progressed with Rt LE strengthening in knee extension out of brace. Patient and wife instructed in [place]. To continue with ice and elevation. Ambulating with straight cane Lt UE with good improvement in gait pattern. Tolerated exercise well. Will ice at home. LM for MD office requesting protocol yesterday and today. Specifically asking about wt bearing status and amount of Rt knee flexion allowed for exercise.    Rehab Potential Good    PT Frequency 2x / week    PT Duration 8 weeks    PT Treatment/Interventions ADLs/Self Care Home Management;Aquatic Therapy;Cryotherapy;Electrical Stimulation;Iontophoresis 31m/ml Dexamethasone;Moist Heat;Ultrasound;Gait training;Stair training;Functional mobility training;Therapeutic activities;Therapeutic exercise;Balance training;Neuromuscular re-education;Patient/family education;Manual techniques;Passive range of motion;Dry needling;Taping;Vasopneumatic Device    PT Next Visit Plan progress with ther exercise per  protocol as indicated to work on AROM, strength, gait, transfers; modalities and manual work as indicated    PT [place] and Agree with Plan of Care Patient;Family member/caregiver    Family Member Consulted wife - Museum/gallery conservator             Patient will benefit from skilled therapeutic intervention in order to improve the following deficits and impairments:     Visit Diagnosis: Avulsion of right patellar tendon, sequela  Other symptoms and signs involving the musculoskeletal system  Muscle weakness (generalized)  Other abnormalities of gait and mobility     Problem List Patient Active Problem List   Diagnosis Date Noted   Fracture of patella, right, closed 10/10/2021   Hypertriglyceridemia 05/18/2014   Prediabetes 05/18/2014   Annual physical exam 05/17/2014   LPRD (laryngopharyngeal reflux disease) 04/10/2013   Smoker 12/04/2012   Hypertension 12/04/2012   Left L5 lumbar radiculopathy 11/04/2012    Emanuel Dowson Nilda Simmer, PT, MPH 12/01/2021, 11:54 AM  [facility] [place], [place], [address] Phone: [phone]   Fax:  [phone]  Name: ALDAHIR LITAKER MRN: [id number] Date of Birth: 03-Dec-1971

## 2021-12-01 NOTE — Patient Instructions (Signed)
Access Code: [email or web address] URL: [email or web address] Date: 12/01/2021 Prepared by: Gillermo Murdoch  Exercises Supine Quad Set - 2 x daily - 7 x weekly - 1-2 sets - 10 reps - 3 sec hold Small Range Straight Leg Raise - 2 x daily - 7 x weekly - 1-2 sets - 10 reps - 5 sec hold Sidelying Hip Abduction - 2 x daily - 7 x weekly - 1-2 sets - 10 reps - 3-5 sec hold Sidelying Hip Adduction - 2 x daily - 7 x weekly - 1-2 sets - 10 reps - 3-5 sec hold Prone Gluteal Sets - 2 x daily - 7 x weekly - 1-2 sets - 10 reps - 10 sec hold Prone Hip Extension - 2 x daily - 7 x weekly - 1-2 sets - 10 reps - 3 sec hold Prone Press Up On Elbows - 2 x daily - 7 x weekly - 1 sets - 3 reps - 30 sec hold Prone Press Up - 2 x daily - 7 x weekly - 1 sets - 10 reps - 2-3 sec hold Hooklying Hamstring Stretch with Strap - 2 x daily - 7 x weekly - 1 sets - 3 reps - 30 sec hold Supine ITB Stretch with Strap - 2 x daily - 7 x weekly - 1 sets - 3 reps - 30 sec hold Long Sitting Calf Stretch with Strap - 2 x daily - 7 x weekly - 1 sets - 3 reps - 30 sec hold

## 2021-12-05 ENCOUNTER — Ambulatory Visit (INDEPENDENT_AMBULATORY_CARE_PROVIDER_SITE_OTHER): Payer: Worker's Compensation | Admitting: Sports Medicine

## 2021-12-05 ENCOUNTER — Other Ambulatory Visit: Payer: Self-pay

## 2021-12-05 VITALS — BP 135/92 | HR 75 | Wt 248.0 lb

## 2021-12-05 DIAGNOSIS — E781 Pure hyperglyceridemia: Secondary | ICD-10-CM | POA: Diagnosis not present

## 2021-12-05 DIAGNOSIS — R195 Other fecal abnormalities: Secondary | ICD-10-CM

## 2021-12-05 DIAGNOSIS — S82021D Displaced longitudinal fracture of right patella, subsequent encounter for closed fracture with routine healing: Secondary | ICD-10-CM

## 2021-12-05 DIAGNOSIS — Z0000 Encounter for general adult medical examination without abnormal findings: Secondary | ICD-10-CM

## 2021-12-05 DIAGNOSIS — Z1211 Encounter for screening for malignant neoplasm of colon: Secondary | ICD-10-CM

## 2021-12-05 NOTE — Assessment & Plan Note (Signed)
Due for colon cancer screening.

## 2021-12-05 NOTE — Assessment & Plan Note (Signed)
Due to recheck routine labs

## 2021-12-05 NOTE — Assessment & Plan Note (Signed)
Nicholas Pearson is now status post closed reduction of a patellar fracture on 1 November. He would like to switch physical therapy to pivot here in [place].

## 2021-12-05 NOTE — Progress Notes (Addendum)
° ° °  Procedures performed today:    None.  Independent interpretation of notes and tests performed by another provider:   None.  Brief History, Exam, Impression, and Recommendations:    Annual physical exam Due for colon cancer screening.  Hypertriglyceridemia Due to recheck routine labs  Fracture of patella, right, closed Nicholas Pearson is now status post closed reduction of a patellar fracture on 1 November. He would like to switch physical therapy to [place] here in [place].  Positive colorectal cancer screening using Cologuard test Referral for colonoscopy    ___________________________________________ Gwen Her. Dianah Field, M.D., ABFM., CAQSM. Primary Care and [place] Instructor of [place] of [facility] [employer]

## 2021-12-06 ENCOUNTER — Ambulatory Visit (INDEPENDENT_AMBULATORY_CARE_PROVIDER_SITE_OTHER): Payer: 59 | Admitting: Physical Therapy

## 2021-12-06 DIAGNOSIS — M6281 Muscle weakness (generalized): Secondary | ICD-10-CM | POA: Diagnosis not present

## 2021-12-06 DIAGNOSIS — R29898 Other symptoms and signs involving the musculoskeletal system: Secondary | ICD-10-CM

## 2021-12-06 DIAGNOSIS — R2689 Other abnormalities of gait and mobility: Secondary | ICD-10-CM

## 2021-12-06 DIAGNOSIS — M25661 Stiffness of right knee, not elsewhere classified: Secondary | ICD-10-CM

## 2021-12-06 NOTE — Therapy (Signed)
[place], [place], [address] Phone: [phone]   Fax:  [phone]  Physical Therapy Treatment  Patient Details  Name: Nicholas Pearson MRN: [id number] Date of Birth: [birth year] Referring Provider (PT): Dr Marchia Bond   Encounter Date: 12/06/2021   PT End of Session - 12/06/21 1106     Visit Number 3    Number of Visits 20    Date for PT Re-Evaluation 02/08/22    PT Start Time 3734    PT Stop Time 1106    PT Time Calculation (min) 43 min    Activity Tolerance Patient tolerated treatment well    Behavior During Therapy [facility] for tasks assessed/performed             Past Medical History:  Diagnosis Date   Hypertension    Patellar tendon rupture    right    Past Surgical History:  Procedure Laterality Date   KNEE SURGERY     left   PATELLAR TENDON REPAIR Right 10/17/2021   Procedure: RIGHT PATELLA TENDON REPAIR;  Surgeon: Marchia Bond, MD;  Location: [place];  Service: Orthopedics;  Laterality: Right;   WISDOM TOOTH EXTRACTION      There were no vitals filed for this visit.   Subjective Assessment - 12/06/21 1219     Subjective Pt reports he has been working on exercises at home; all are going well.    Patient Stated Goals get back walking to return to work    Currently in Pain? No/denies    Pain Score 0-No pain                [facility] PT Assessment - 12/06/21 0001       Assessment   Medical Diagnosis Rt patella tendon repair    Referring Provider (PT) Dr Marchia Bond    Onset Date/Surgical Date 10/06/21   surgery 10/17/21   Hand Dominance Right    Next MD Visit 12/25/21    Prior Therapy during college basketball      Precautions   Precautions Other (comment)    Precaution Comments post op precautiong per protocol    Required Braces or Orthoses Other Brace/Splint    Other Brace/Splint long leg brace locked 0-30 degrees flexion                [facility] Adult PT Treatment/Exercise - 12/06/21 0001       Self-Care   Self-Care Scar Mobilizations    Scar Mobilizations educated pt on scar mobilization; pt verbalized understanding      Knee/Hip Exercises: Stretches   Passive Hamstring Stretch Right;1 rep;30 seconds      Knee/Hip Exercises: Standing   Stairs reviewed stairs with step-to pattern (up/down 3 stairs) pt returned demo with bilat rail.    SLS Rt SLS with use of cane in Lt hand x 5 sec    Other Standing Knee Exercises weight shifts R/L with LUE on cane.      Knee/Hip Exercises: Supine   Quad Sets Strengthening;Right;1 set;10 reps   10 sec hold   Quad Sets Limitations 50% contraction    Heel Slides Right;1 set;10 reps   to 30 per protocol   Hip Adduction Isometric Strengthening;Both;1 set;5 reps   pillow squeeze with knee straight; with glute set - 5 sec hold   Straight Leg Raises Strengthening;Right;1 set;10 reps    Straight Leg Raises Limitations 2 sec hold    Other Supine Knee/Hip Exercises Rt  hamstring isometrics,5 sec hold, x 10 (@ 20 flexion)    Other Supine Knee/Hip Exercises Rt resisted PF (with leg in long sitting, pillow under knee) x 20 reps with green band      Knee/Hip Exercises: Sidelying   Hip ABduction Strengthening;Right;1 set;10 reps      Knee/Hip Exercises: Prone   Hip Extension --   verbally reviewed     Modalities   Modalities --   declined; icing at home.               PT Short Term Goals - 11/30/21 1238       PT SHORT TERM GOAL #1   Title Evaluation of ROM/strength Rt LE as indicated    Time 4    Period Weeks    Status New    Target Date 12/28/21      PT SHORT TERM GOAL #2   Title Increase AROM Rt knee to 0 deg extension; 90 or greater flexion    Time 4    Period Weeks    Status New    Target Date 12/28/21      PT SHORT TERM GOAL #3   Title Independent with gait with least restrictive assistive device - weight bearing as allowed by MD    Time 4    Period Weeks     Status New    Target Date 12/28/21      PT SHORT TERM GOAL #4   Title Independent in initial HEP including aquatic exercises as indicated    Time 6    Period Weeks    Status New    Target Date 01/11/22               PT Long Term Goals - 11/30/21 1240       PT LONG TERM GOAL #1   Title Increase AROM Rt knee to WFL's allowing patient to bend knee for all transfers and transitional movements necessary for return to work(RTW)    Time 12    Period Weeks    Status New    Target Date 02/08/22      PT LONG TERM GOAL #2   Title Rt LE strength 4+/5 to 5/5    Time 12    Period Weeks    Status New    Target Date 02/08/22      PT LONG TERM GOAL #3   Title Independent ambulation without assistive device with good gait pattern allowing patient to ambulate for community and work distances for RTW    Time 12    Period Weeks    Status New    Target Date 02/08/22      PT LONG TERM GOAL #4   Title Independent in HEP including aquatic program prior to discharge    Time 12    Period Weeks    Status New    Target Date 02/08/22      PT LONG TERM GOAL #5   Title Improve functional limitation score to 64    Time 12    Period Weeks    Status New    Target Date 02/08/22                   Plan - 12/06/21 1225     Clinical Impression Statement Per call from PA at surgeon's office, pt is WBAT and to restrict knee flexion to 30 until MD appt on 12/25/21.  He tolerated exercises well without difficulty.  Progressing towards goals.  Rehab Potential Good    PT Frequency 2x / week    PT Duration 8 weeks    PT Treatment/Interventions ADLs/Self Care Home Management;Aquatic Therapy;Cryotherapy;Electrical Stimulation;Iontophoresis 25m/ml Dexamethasone;Moist Heat;Ultrasound;Gait training;Stair training;Functional mobility training;Therapeutic activities;Therapeutic exercise;Balance training;Neuromuscular re-education;Patient/family education;Manual techniques;Passive range of  motion;Dry needling;Taping;Vasopneumatic Device    PT Next Visit Plan progress with ther exercise per protocol as indicated to work on AROM, strength, gait, transfers; modalities and manual work as indicated    PL[place]and Agree with Plan of Care Patient             Patient will benefit from skilled therapeutic intervention in order to improve the following deficits and impairments:  Abnormal gait, Decreased range of motion, Decreased activity tolerance, Decreased balance, Impaired flexibility, Decreased mobility, Decreased strength, Increased edema, Postural dysfunction  Visit Diagnosis: Stiffness of right knee  Other symptoms and signs involving the musculoskeletal system  Muscle weakness (generalized)  Other abnormalities of gait and mobility     Problem List Patient Active Problem List   Diagnosis Date Noted   Fracture of patella, right, closed 10/10/2021   Hypertriglyceridemia 05/18/2014   Prediabetes 05/18/2014   Annual physical exam 05/17/2014   LPRD (laryngopharyngeal reflux disease) 04/10/2013   Smoker 12/04/2012   Hypertension 12/04/2012   Left L5 lumbar radiculopathy 11/04/2012   JKerin Perna PTA 12/06/21 12:30 PM   [place] [address]Phone: [phone]  Fax:  [phone] Name: Nicholas HELDMANMRN: [id number] of Birth: 523-Jun-1972

## 2021-12-08 ENCOUNTER — Ambulatory Visit (INDEPENDENT_AMBULATORY_CARE_PROVIDER_SITE_OTHER): Payer: 59 | Admitting: Rehabilitative and Restorative Service Providers"

## 2021-12-08 ENCOUNTER — Other Ambulatory Visit: Payer: Self-pay

## 2021-12-08 ENCOUNTER — Encounter: Payer: Self-pay | Admitting: Rehabilitative and Restorative Service Providers"

## 2021-12-08 DIAGNOSIS — R2689 Other abnormalities of gait and mobility: Secondary | ICD-10-CM | POA: Diagnosis not present

## 2021-12-08 DIAGNOSIS — M25661 Stiffness of right knee, not elsewhere classified: Secondary | ICD-10-CM | POA: Diagnosis not present

## 2021-12-08 DIAGNOSIS — M6281 Muscle weakness (generalized): Secondary | ICD-10-CM | POA: Diagnosis not present

## 2021-12-08 DIAGNOSIS — R29898 Other symptoms and signs involving the musculoskeletal system: Secondary | ICD-10-CM

## 2021-12-08 NOTE — Therapy (Signed)
[place], [place], [address] Phone: [phone]   Fax:  [phone]  Physical Therapy Treatment  Patient Details  Name: Nicholas Pearson MRN: [id number] Date of Birth: [birth year] Referring Provider (PT): Dr Marchia Bond   Encounter Date: 12/08/2021   PT End of Session - 12/08/21 0927     Visit Number 4    Number of Visits 20    Date for PT Re-Evaluation 02/08/22    PT Start Time 0927    Activity Tolerance Patient tolerated treatment well             Past Medical History:  Diagnosis Date   Hypertension    Patellar tendon rupture    right    Past Surgical History:  Procedure Laterality Date   KNEE SURGERY     left   PATELLAR TENDON REPAIR Right 10/17/2021   Procedure: RIGHT PATELLA TENDON REPAIR;  Surgeon: Marchia Bond, MD;  Location: [place];  Service: Orthopedics;  Laterality: Right;   WISDOM TOOTH EXTRACTION      There were no vitals filed for this visit.   Subjective Assessment - 12/08/21 0928     Subjective No pain in Rt LE - exercises are going well.    Currently in Pain? No/denies    Pain Score 0-No pain    Pain Orientation Right                               [facility] Adult PT Treatment/Exercise - 12/08/21 0001       Knee/Hip Exercises: Stretches   Knee: Self-Stretch to increase Flexion Right;5 reps;10 seconds    Knee: Self-Stretch Limitations supine with strap      Knee/Hip Exercises: Seated   Other Seated Knee/Hip Exercises isometric hamstring knee in 30 deg flexion 10 sec hold x 10 reps      Knee/Hip Exercises: Supine   Quad Sets Strengthening;Right;1 set;10 reps   10 sec hold   Quad Sets Limitations 50% contraction    Heel Slides Right;1 set;10 reps   to 30 per protocol   Hip Adduction Isometric Strengthening;Both;1 set;5 reps   pillow squeeze with knee straight; with glute set - 5 sec hold   Hip Adduction Isometric  Limitations added adductor squeeze with ~ 30 deg knee flexion    Straight Leg Raises Strengthening;Right;1 set;10 reps    Straight Leg Raises Limitations 3 sec hold    Straight Leg Raise with External Rotation Strengthening;Right;10 reps    Straight Leg Raise with External Rotation Limitations 3 sec hold    Other Supine Knee/Hip Exercises Rt hamstring isometrics,5 sec hold, x 10 (@ 20 flexion)      Knee/Hip Exercises: Sidelying   Hip ABduction Strengthening;Right;1 set;10 reps    Hip ABduction Limitations 5 sec hold    Hip ADduction Strengthening;Right;1 set;10 reps    Hip ADduction Limitations 5 sec hold    Other Sidelying Knee/Hip Exercises toe taps in abduction back x 3 and forward x 3  total of 10 reps      Knee/Hip Exercises: Prone   Hip Extension Strengthening;Right;1 set;10 reps    Hip Extension Limitations 5 sec hold    Other Prone Exercises quad set toe resting on table 5 sec hold x 10 alternating LEs    Other Prone Exercises alternate arm/leg raise 2-3 sec hold x 10 each side alternating  PT Education - 12/08/21 0958     Education Details HEP    Person(s) Educated Patient    Methods Explanation;Demonstration;Tactile cues;Verbal cues;Handout    Comprehension Verbalized understanding;Returned demonstration;Verbal cues required;Tactile cues required              PT Short Term Goals - 11/30/21 1238       PT SHORT TERM GOAL #1   Title Evaluation of ROM/strength Rt LE as indicated    Time 4    Period Weeks    Status New    Target Date 12/28/21      PT SHORT TERM GOAL #2   Title Increase AROM Rt knee to 0 deg extension; 90 or greater flexion    Time 4    Period Weeks    Status New    Target Date 12/28/21      PT SHORT TERM GOAL #3   Title Independent with gait with least restrictive assistive device - weight bearing as allowed by MD    Time 4    Period Weeks    Status New    Target Date 12/28/21      PT SHORT TERM GOAL #4    Title Independent in initial HEP including aquatic exercises as indicated    Time 6    Period Weeks    Status New    Target Date 01/11/22               PT Long Term Goals - 11/30/21 1240       PT LONG TERM GOAL #1   Title Increase AROM Rt knee to WFL's allowing patient to bend knee for all transfers and transitional movements necessary for return to work(RTW)    Time 12    Period Weeks    Status New    Target Date 02/08/22      PT LONG TERM GOAL #2   Title Rt LE strength 4+/5 to 5/5    Time 12    Period Weeks    Status New    Target Date 02/08/22      PT LONG TERM GOAL #3   Title Independent ambulation without assistive device with good gait pattern allowing patient to ambulate for community and work distances for RTW    Time 12    Period Weeks    Status New    Target Date 02/08/22      PT LONG TERM GOAL #4   Title Independent in HEP including aquatic program prior to discharge    Time 12    Period Weeks    Status New    Target Date 02/08/22      PT LONG TERM GOAL #5   Title Improve functional limitation score to 64    Time 12    Period Weeks    Status New    Target Date 02/08/22                   Plan - 12/08/21 0932     Clinical Impression Statement Patient continues to work on [place]. Continue to observe MD restrictions WBAT; limit Rt knee flexion to 30 deg.    Rehab Potential Good    PT Frequency 2x / week    PT Duration 8 weeks    PT Treatment/Interventions ADLs/Self Care Home Management;Aquatic Therapy;Cryotherapy;Electrical Stimulation;Iontophoresis 4m/ml Dexamethasone;Moist Heat;Ultrasound;Gait training;Stair training;Functional mobility training;Therapeutic activities;Therapeutic exercise;Balance training;Neuromuscular re-education;Patient/family education;Manual techniques;Passive range of motion;Dry needling;Taping;Vasopneumatic Device    PT Next Visit Plan progress with ther  exercise per protocol as indicated to work on AROM, strength,  gait, transfers; modalities and manual work as indicated    PT [place] and Agree with Plan of Care Patient             Patient will benefit from skilled therapeutic intervention in order to improve the following deficits and impairments:     Visit Diagnosis: Stiffness of right knee  Other symptoms and signs involving the musculoskeletal system  Muscle weakness (generalized)  Other abnormalities of gait and mobility     Problem List Patient Active Problem List   Diagnosis Date Noted   Fracture of patella, right, closed 10/10/2021   Hypertriglyceridemia 05/18/2014   Prediabetes 05/18/2014   Annual physical exam 05/17/2014   LPRD (laryngopharyngeal reflux disease) 04/10/2013   Smoker 12/04/2012   Hypertension 12/04/2012   Left L5 lumbar radiculopathy 11/04/2012    Nikodem Leadbetter Nilda Simmer, PT, MPH  12/08/2021, 10:16 AM  [facility] [place], [place], [address] Phone: [phone]   Fax:  [phone]  Name: KARLA VINES MRN: [id number] Date of Birth: 01-14-1971

## 2021-12-13 ENCOUNTER — Ambulatory Visit (INDEPENDENT_AMBULATORY_CARE_PROVIDER_SITE_OTHER): Payer: 59 | Admitting: Physical Therapy

## 2021-12-13 ENCOUNTER — Other Ambulatory Visit: Payer: Self-pay

## 2021-12-13 DIAGNOSIS — R2689 Other abnormalities of gait and mobility: Secondary | ICD-10-CM | POA: Diagnosis not present

## 2021-12-13 DIAGNOSIS — R29898 Other symptoms and signs involving the musculoskeletal system: Secondary | ICD-10-CM | POA: Diagnosis not present

## 2021-12-13 DIAGNOSIS — M25661 Stiffness of right knee, not elsewhere classified: Secondary | ICD-10-CM

## 2021-12-13 DIAGNOSIS — M6281 Muscle weakness (generalized): Secondary | ICD-10-CM

## 2021-12-13 NOTE — Patient Instructions (Signed)
Aquatic Therapy: What to Expect!  Where:  [facility] at [facility] [address] [place], [place] [address] [phone]           How to Prepare:  If you require assistance with dressing, with transportation (ie: wheel chair), or toileting, a caregiver must attend the entire session with you (unless your primary therapists feels this is not necessary).   If there is thunder during your appointment, you will be asked to leave pool area. You have the option to finish your session in the physical therapy area near the gym. Masks in the pool area are optional. Your face will remain dry during your session, so you are welcome to keep your mask on, if desired. You will be spaced at least 6 feet from other aquatic patients.  Please bring your own swim towel to dry off with.   There are Men's and Women's locker rooms with showers, as well as gender neutral bathrooms in the pool area.  Please arrive IN YOUR SUIT and a few minutes prior to your appointment - this helps to avoid delays in starting your session. Head to the pool and await your appointment on the bench on the pool deck. Please make sure to attend to any toileting needs prior to entering the pool. Once on the pool deck your therapist will ask you to sign the Patient  Consent and Assignment of Benefits form. Your therapist may take your blood pressure prior to, during and after your session if indicated. We usually try and create a home exercise program based on activities we do in the pool. Some patients do not want to or do not have the ability to participate in an aquatic home program - this is not a barrier in any way to you participating in aquatic therapy as part of your current therapy plan!  Appointments:  All sessions are 45 minutes  About the pool: Entering the pool: Your therapist will assist you if needed; there are two ways to enter the pool - stairs or a mechanical lift. Your therapist will determine  the most appropriate way for you. Water temperature is usually around 91-95.  There is a lap pool with a temperature around 84 There may be other therapists and patients in the pool at the same time.   Contact Info:            To cancel appointment, please call [facility] at [phone] If you are running late, please call [facility] at [phone]

## 2021-12-13 NOTE — Therapy (Signed)
[place], [place], [address] Phone: [phone]   Fax:  [phone]  Physical Therapy Treatment  Patient Details  Name: Nicholas Pearson MRN: [id number] Date of Birth: 1971/04/22 Referring Provider (PT): Dr Marchia Bond   Encounter Date: 12/13/2021   PT End of Session - 12/13/21 1051     Visit Number 5    Number of Visits 20    Date for PT Re-Evaluation 02/08/22    PT Start Time 5176    PT Stop Time 1100    PT Time Calculation (min) 45 min    Activity Tolerance Patient tolerated treatment well    Behavior During Therapy [facility] for tasks assessed/performed             Past Medical History:  Diagnosis Date   Hypertension    Patellar tendon rupture    right    Past Surgical History:  Procedure Laterality Date   KNEE SURGERY     left   PATELLAR TENDON REPAIR Right 10/17/2021   Procedure: RIGHT PATELLA TENDON REPAIR;  Surgeon: Marchia Bond, MD;  Location: [place];  Service: Orthopedics;  Laterality: Right;   WISDOM TOOTH EXTRACTION      There were no vitals filed for this visit.   Subjective Assessment - 12/13/21 1023     Subjective Pt states HEP is going well, no pain    Patient Stated Goals get back walking to return to work    Currently in Pain? No/denies                [facility] PT Assessment - 12/13/21 0001       Assessment   Medical Diagnosis Rt patella tendon repair    Referring Provider (PT) Dr Marchia Bond    Onset Date/Surgical Date 10/06/21   surgery 10/17/21   Hand Dominance Right    Next MD Visit 12/25/21    Prior Therapy during college basketball      Precautions   Precautions Other (comment)    Precaution Comments post op precautiong per protocol    Required Braces or Orthoses Other Brace/Splint    Other Brace/Splint long leg brace locked 0-30 degrees flexion      Strength   Overall Strength Comments Rt hip strength 4/5 as allowed by  precautions                           [facility] Adult PT Treatment/Exercise - 12/13/21 0001       Blood Flow Restriction   Blood Flow Restriction Yes      Blood Flow Restriction-Positions    Blood Flow Restriction Position Supine      BFR-Supine   Supine Exercise Pressure (mmHg) 140    Supine Exercise Prescription 30,15,15,15, reps w/ 30-60 sec rest    Supine Exercise Prescription Comment SLR Rt LE      Knee/Hip Exercises: Stretches   Knee: Self-Stretch to increase Flexion Right;5 reps;10 seconds      Knee/Hip Exercises: Supine   Quad Sets Strengthening;Right;1 set;10 reps   10 sec hold   Heel Slides Right;1 set;10 reps   to 30 per protocol   Hip Adduction Isometric Strengthening;10 reps    Hip Adduction Isometric Limitations at 30 degrees knee flexion    Straight Leg Raises Strengthening;Right;1 set;10 reps    Straight Leg Raise with External Rotation Strengthening;Right;10 reps      Knee/Hip Exercises: Sidelying   Hip  ABduction Strengthening;Right;1 set;10 reps    Hip ADduction Strengthening;Right;1 set;10 reps    Other Sidelying Knee/Hip Exercises toe taps in abduction back x 3 and forward x 3  total of 10 reps      Knee/Hip Exercises: Prone   Hip Extension Strengthening;Right;1 set;10 reps    Other Prone Exercises alternate arm/leg raise 2-3 sec hold x 10 each side alternating                     PT Education - 12/13/21 1051     Education Details Aquatic info    Person(s) Educated Patient    Methods Explanation;Handout    Comprehension Verbalized understanding              PT Short Term Goals - 11/30/21 1238       PT SHORT TERM GOAL #1   Title Evaluation of ROM/strength Rt LE as indicated    Time 4    Period Weeks    Status New    Target Date 12/28/21      PT SHORT TERM GOAL #2   Title Increase AROM Rt knee to 0 deg extension; 90 or greater flexion    Time 4    Period Weeks    Status New    Target Date 12/28/21      PT  SHORT TERM GOAL #3   Title Independent with gait with least restrictive assistive device - weight bearing as allowed by MD    Time 4    Period Weeks    Status New    Target Date 12/28/21      PT SHORT TERM GOAL #4   Title Independent in initial HEP including aquatic exercises as indicated    Time 6    Period Weeks    Status New    Target Date 01/11/22               PT Long Term Goals - 11/30/21 1240       PT LONG TERM GOAL #1   Title Increase AROM Rt knee to WFL's allowing patient to bend knee for all transfers and transitional movements necessary for return to work(RTW)    Time 12    Period Weeks    Status New    Target Date 02/08/22      PT LONG TERM GOAL #2   Title Rt LE strength 4+/5 to 5/5    Time 12    Period Weeks    Status New    Target Date 02/08/22      PT LONG TERM GOAL #3   Title Independent ambulation without assistive device with good gait pattern allowing patient to ambulate for community and work distances for RTW    Time 12    Period Weeks    Status New    Target Date 02/08/22      PT LONG TERM GOAL #4   Title Independent in HEP including aquatic program prior to discharge    Time 12    Period Weeks    Status New    Target Date 02/08/22      PT LONG TERM GOAL #5   Title Improve functional limitation score to 64    Time 12    Period Weeks    Status New    Target Date 02/08/22                   Plan - 12/13/21 1051     Clinical  Impression Statement Pt with good tolerance to BFR, good challenge. Pt continues to progress well with HEP and hip and knee strength per protocol and restrictions    PT Next Visit Plan BFR, progress as tolerated, standing therex (sidesteps, mini squat?)    PT Home Exercise Plan [id number]    Consulted and Agree with Plan of Care Patient             Patient will benefit from skilled therapeutic intervention in order to improve the following deficits and impairments:     Visit Diagnosis: Stiffness  of right knee  Muscle weakness (generalized)  Other symptoms and signs involving the musculoskeletal system  Other abnormalities of gait and mobility     Problem List Patient Active Problem List   Diagnosis Date Noted   Fracture of patella, right, closed 10/10/2021   Hypertriglyceridemia 05/18/2014   Prediabetes 05/18/2014   Annual physical exam 05/17/2014   LPRD (laryngopharyngeal reflux disease) 04/10/2013   Smoker 12/04/2012   Hypertension 12/04/2012   Left L5 lumbar radiculopathy 11/04/2012    Allayah Raineri, PT 12/13/2021, 11:07 AM  [facility] [place], [place], [address] Phone: [phone]   Fax:  [phone]  Name: Nicholas Pearson MRN: [id number] Date of Birth: 03-May-1971

## 2021-12-15 ENCOUNTER — Ambulatory Visit: Payer: 59 | Admitting: Physical Therapy

## 2021-12-19 ENCOUNTER — Other Ambulatory Visit: Payer: Self-pay

## 2021-12-19 ENCOUNTER — Ambulatory Visit: Payer: Worker's Compensation | Attending: Orthopedic Surgery | Admitting: Physical Therapy

## 2021-12-19 DIAGNOSIS — R29898 Other symptoms and signs involving the musculoskeletal system: Secondary | ICD-10-CM

## 2021-12-19 DIAGNOSIS — M25661 Stiffness of right knee, not elsewhere classified: Secondary | ICD-10-CM

## 2021-12-19 DIAGNOSIS — X58XXXD Exposure to other specified factors, subsequent encounter: Secondary | ICD-10-CM | POA: Insufficient documentation

## 2021-12-19 DIAGNOSIS — M6281 Muscle weakness (generalized): Secondary | ICD-10-CM | POA: Insufficient documentation

## 2021-12-19 DIAGNOSIS — R2689 Other abnormalities of gait and mobility: Secondary | ICD-10-CM | POA: Insufficient documentation

## 2021-12-19 DIAGNOSIS — S82021D Displaced longitudinal fracture of right patella, subsequent encounter for closed fracture with routine healing: Secondary | ICD-10-CM | POA: Diagnosis not present

## 2021-12-19 NOTE — Therapy (Signed)
[place], [place], [address] Phone: [phone]   Fax:  [phone]  Physical Therapy Treatment  Patient Details  Name: Nicholas Pearson MRN: [id number] Date of Birth: 06/18/1971 Referring Provider (PT): Dr Marchia Bond   Encounter Date: 12/19/2021   PT End of Session - 12/19/21 1016     Visit Number 6    Number of Visits 20    Date for PT Re-Evaluation 02/08/22    PT Start Time 0935    PT Stop Time 1013    PT Time Calculation (min) 38 min    Activity Tolerance Patient tolerated treatment well    Behavior During Therapy [facility] for tasks assessed/performed             Past Medical History:  Diagnosis Date   Hypertension    Patellar tendon rupture    right    Past Surgical History:  Procedure Laterality Date   KNEE SURGERY     left   PATELLAR TENDON REPAIR Right 10/17/2021   Procedure: RIGHT PATELLA TENDON REPAIR;  Surgeon: Marchia Bond, MD;  Location: [place];  Service: Orthopedics;  Laterality: Right;   WISDOM TOOTH EXTRACTION      There were no vitals filed for this visit.   Subjective Assessment - 12/19/21 1308     Subjective Pt reports he tolerated BFR last session.  He missed last session due to stomach bug; feeling better now.    Patient Stated Goals get back walking to return to work    Currently in Pain? No/denies    Pain Score 0-No pain                [facility] PT Assessment - 12/19/21 0001       Assessment   Medical Diagnosis Rt patella tendon repair    Referring Provider (PT) Dr Marchia Bond    Onset Date/Surgical Date 10/06/21   surgery 10/17/21   Hand Dominance Right    Next MD Visit 12/25/21    Prior Therapy during college basketball              [facility] Adult PT Treatment/Exercise - 12/19/21 0001       BFR-Supine   Supine Exercise Pressure (mmHg) 140    Supine Exercise Prescription 30,15,15,15, reps w/ 30-60 sec rest    Supine  Exercise Prescription Comment Rt SLR (for first 3 sets, hip abdct 30, 15)      Knee/Hip Exercises: Stretches   Gastroc Stretch Right;2 reps;30 seconds      Knee/Hip Exercises: Standing   Heel Raises Both;1 set;15 reps    Terminal Knee Extension Strengthening;Right;1 set    SLS Rt SLS with Lt toe taps front, side, back x 5 reps;  Rt SLS x 10 sec x 2.      Knee/Hip Exercises: Seated   Other Seated Knee/Hip Exercises isometric hamstring knee in 30 deg flexion 10 sec hold x 10 reps;  at 0 x 10 sec x 5 reps      Knee/Hip Exercises: Supine   Other Supine Knee/Hip Exercises resisted Rt ankle eversion, inversion, DF x 10 reps, 2 sets with green band.      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 Pt given safe removal verbal instructions.                       PT Short Term Goals - 12/19/21 1313       PT SHORT TERM GOAL #1   Title Evaluation of ROM/strength Rt LE as indicated    Time 4    Period Weeks    Status On-going    Target Date 12/28/21      PT SHORT TERM GOAL #2   Title Increase AROM Rt knee to 0 deg extension; 90 or greater flexion    Time 4    Period Weeks    Status On-going    Target Date 12/28/21      PT SHORT TERM GOAL #3   Title Independent with gait with least restrictive assistive device - weight bearing as allowed by MD    Time 4    Period Weeks    Status Achieved    Target Date 12/28/21      PT SHORT TERM GOAL #4   Title Independent in initial HEP including aquatic exercises as indicated    Time 6    Period Weeks    Status Achieved    Target Date 01/11/22               PT Long Term Goals - 11/30/21 1240       PT LONG TERM GOAL #1   Title Increase AROM Rt knee to WFL's  allowing patient to bend knee for all transfers and transitional movements necessary for return to work(RTW)    Time 12    Period Weeks    Status New    Target Date 02/08/22      PT LONG TERM GOAL #2   Title Rt LE strength 4+/5 to 5/5    Time 12    Period Weeks    Status New    Target Date 02/08/22      PT LONG TERM GOAL #3   Title Independent ambulation without assistive device with good gait pattern allowing patient to ambulate for community and work distances for RTW    Time 12    Period Weeks    Status New    Target Date 02/08/22      PT LONG TERM GOAL #4   Title Independent in HEP including aquatic program prior to discharge    Time 12    Period Weeks    Status New    Target Date 02/08/22      PT LONG TERM GOAL #5   Title Improve functional limitation score to 64    Time 12    Period Weeks    Status New    Target Date 02/08/22                   Plan - 12/19/21 1309     Clinical Impression Statement Pt tolerated all exercises well, including BFR with SLR and hip abdct.  Trialed standing balance exercises with brace donned.  Progressing well within rehab restrictions.    PT Next Visit Plan BFR, progress as tolerated.   PT Home Exercise Plan [id number]    Consulted and Agree with Plan of Care Patient             Patient will benefit from skilled therapeutic intervention in order to improve the following deficits and impairments:  Visit Diagnosis: Stiffness of right knee  Muscle weakness (generalized)  Other symptoms and signs involving the musculoskeletal system     Problem List Patient Active Problem List   Diagnosis Date Noted   Fracture of patella, right, closed 10/10/2021   Hypertriglyceridemia 05/18/2014   Prediabetes 05/18/2014   Annual physical exam 05/17/2014   LPRD (laryngopharyngeal reflux disease) 04/10/2013   Smoker 12/04/2012   Hypertension 12/04/2012   Left L5 lumbar radiculopathy 11/04/2012   Kerin Perna,  PTA 12/19/21 1:14 PM  [place], [place], [address] Phone: [phone]   Fax:  [phone]  Name: Nicholas Pearson MRN: [id number] Date of Birth: 1971-06-03

## 2021-12-22 ENCOUNTER — Other Ambulatory Visit: Payer: Self-pay

## 2021-12-22 ENCOUNTER — Ambulatory Visit: Payer: Worker's Compensation | Admitting: Physical Therapy

## 2021-12-22 DIAGNOSIS — R2689 Other abnormalities of gait and mobility: Secondary | ICD-10-CM

## 2021-12-22 DIAGNOSIS — S82021D Displaced longitudinal fracture of right patella, subsequent encounter for closed fracture with routine healing: Secondary | ICD-10-CM | POA: Diagnosis not present

## 2021-12-22 DIAGNOSIS — M25661 Stiffness of right knee, not elsewhere classified: Secondary | ICD-10-CM

## 2021-12-22 DIAGNOSIS — M6281 Muscle weakness (generalized): Secondary | ICD-10-CM

## 2021-12-22 DIAGNOSIS — R29898 Other symptoms and signs involving the musculoskeletal system: Secondary | ICD-10-CM

## 2021-12-22 NOTE — Therapy (Signed)
[place], [place], [address] Phone: [phone]   Fax:  [phone]  Physical Therapy Treatment  Patient Details  Name: Nicholas Pearson MRN: [id number] Date of Birth: 1971-02-26 Referring Provider (PT): Dr Marchia Bond   Encounter Date: 12/22/2021   PT End of Session - 12/22/21 1358     Visit Number 7    Number of Visits 20    Date for PT Re-Evaluation 02/08/22    PT Start Time 4034    PT Stop Time 7425    PT Time Calculation (min) 43 min    Activity Tolerance Patient tolerated treatment well    Behavior During Therapy [facility] for tasks assessed/performed             Past Medical History:  Diagnosis Date   Hypertension    Patellar tendon rupture    right    Past Surgical History:  Procedure Laterality Date   KNEE SURGERY     left   PATELLAR TENDON REPAIR Right 10/17/2021   Procedure: RIGHT PATELLA TENDON REPAIR;  Surgeon: Marchia Bond, MD;  Location: [place];  Service: Orthopedics;  Laterality: Right;   WISDOM TOOTH EXTRACTION      There were no vitals filed for this visit.   Subjective Assessment - 12/22/21 1319     Subjective Pt states he has no pain but is feeling "tight" in his ITB    Patient Stated Goals get back walking to return to work    Currently in Pain? No/denies                [facility] PT Assessment - 12/22/21 0001       Assessment   Medical Diagnosis Rt patella tendon repair    Referring Provider (PT) Dr Marchia Bond    Onset Date/Surgical Date 10/06/21   surgery 10/17/21   Hand Dominance Right    Next MD Visit 12/25/21    Prior Therapy during college basketball      AROM   Overall AROM Comments Rt knee flexion 30 (as allowed per protocol)                           [facility] Adult PT Treatment/Exercise - 12/22/21 0001       BFR-Supine   Supine Exercise Pressure (mmHg) 140    Supine Exercise Prescription 30,15,15,15,  reps w/ 30-60 sec rest      Knee/Hip Exercises: Stretches   Gastroc Stretch Right;2 reps;30 seconds      Knee/Hip Exercises: Standing   Heel Raises 20 reps    Terminal Knee Extension Strengthening;Right;15 reps    SLS Rt SLS with taps fwd/bkwd/side x 10, Rt SLS x 20 sec with intermittent UE support      Knee/Hip Exercises: Seated   Other Seated Knee/Hip Exercises seated on red physioball, single leg balance on Rt LE 3 x 30 sec      Knee/Hip Exercises: Supine   Other Supine Knee/Hip Exercises resisted Rt ankle eversion, inversion, DF x 10 reps, 2 sets with green band.      Knee/Hip Exercises: Sidelying   Hip ADduction Strengthening;2 sets;10 reps    Other Sidelying Knee/Hip Exercises toe taps in abdcution 3 taps fwd/3 taps backward x 10      Knee/Hip Exercises: Prone   Other Prone Exercises alternate arm/leg raise 2-3 sec hold x 10 each side alternating  PT Short Term Goals - 12/19/21 1313       PT SHORT TERM GOAL #1   Title Evaluation of ROM/strength Rt LE as indicated    Time 4    Period Weeks    Status On-going    Target Date 12/28/21      PT SHORT TERM GOAL #2   Title Increase AROM Rt knee to 0 deg extension; 90 or greater flexion    Time 4    Period Weeks    Status On-going    Target Date 12/28/21      PT SHORT TERM GOAL #3   Title Independent with gait with least restrictive assistive device - weight bearing as allowed by MD    Time 4    Period Weeks    Status Achieved    Target Date 12/28/21      PT SHORT TERM GOAL #4   Title Independent in initial HEP including aquatic exercises as indicated    Time 6    Period Weeks    Status Achieved    Target Date 01/11/22               PT Long Term Goals - 11/30/21 1240       PT LONG TERM GOAL #1   Title Increase AROM Rt knee to WFL's allowing patient to bend knee for all transfers and transitional movements necessary for return to work(RTW)    Time 12    Period Weeks     Status New    Target Date 02/08/22      PT LONG TERM GOAL #2   Title Rt LE strength 4+/5 to 5/5    Time 12    Period Weeks    Status New    Target Date 02/08/22      PT LONG TERM GOAL #3   Title Independent ambulation without assistive device with good gait pattern allowing patient to ambulate for community and work distances for RTW    Time 12    Period Weeks    Status New    Target Date 02/08/22      PT LONG TERM GOAL #4   Title Independent in HEP including aquatic program prior to discharge    Time 12    Period Weeks    Status New    Target Date 02/08/22      PT LONG TERM GOAL #5   Title Improve functional limitation score to 64    Time 12    Period Weeks    Status New    Target Date 02/08/22                   Plan - 12/22/21 1358     Clinical Impression Statement Pt continues with good tolerance of standing balance with brace on and BFR. He is progressing well towards goals as allowed per protocol    PT Next Visit Plan check for update from MD and progress as tolerated    PT Home Exercise Plan [id number]    Consulted and Agree with Plan of Care Patient             Patient will benefit from skilled therapeutic intervention in order to improve the following deficits and impairments:     Visit Diagnosis: Stiffness of right knee  Muscle weakness (generalized)  Other symptoms and signs involving the musculoskeletal system  Other abnormalities of gait and mobility     Problem List Patient Active Problem List  Diagnosis Date Noted   Fracture of patella, right, closed 10/10/2021   Hypertriglyceridemia 05/18/2014   Prediabetes 05/18/2014   Annual physical exam 05/17/2014   LPRD (laryngopharyngeal reflux disease) 04/10/2013   Smoker 12/04/2012   Hypertension 12/04/2012   Left L5 lumbar radiculopathy 11/04/2012    Payslee Bateson, PT 12/22/2021, 2:00 PM  [facility] [place], [place], [address] Phone: [phone]   Fax:  [phone]  Name: Nicholas Pearson MRN: [id number] Date of Birth: 26-Feb-1971

## 2021-12-24 ENCOUNTER — Other Ambulatory Visit: Payer: Self-pay | Admitting: Sports Medicine

## 2021-12-24 DIAGNOSIS — I10 Essential (primary) hypertension: Secondary | ICD-10-CM

## 2021-12-24 LAB — COLOGUARD: COLOGUARD: POSITIVE — AB

## 2021-12-25 ENCOUNTER — Ambulatory Visit: Payer: Worker's Compensation | Admitting: Physical Therapy

## 2021-12-25 DIAGNOSIS — R195 Other fecal abnormalities: Secondary | ICD-10-CM | POA: Insufficient documentation

## 2021-12-25 NOTE — Addendum Note (Signed)
Addended by: Silverio Decamp on: 12/25/2021 11:14 AM   Modules accepted: Orders

## 2021-12-25 NOTE — Assessment & Plan Note (Signed)
Referral for colonoscopy

## 2021-12-26 LAB — COMPREHENSIVE METABOLIC PANEL
AG Ratio: 1.5 (calc) (ref 1.0–2.5)
ALT: 42 U/L (ref 9–46)
AST: 24 U/L (ref 10–40)
Albumin: 4.4 g/dL (ref 3.6–5.1)
Alkaline phosphatase (APISO): 49 U/L (ref 36–130)
BUN: 12 mg/dL (ref 7–25)
CO2: 25 mmol/L (ref 20–32)
Calcium: 9.8 mg/dL (ref 8.6–10.3)
Chloride: 106 mmol/L (ref 98–110)
Creat: 0.99 mg/dL (ref 0.60–1.29)
Globulin: 3 g/dL (calc) (ref 1.9–3.7)
Glucose, Bld: 87 mg/dL (ref 65–99)
Potassium: 4.1 mmol/L (ref 3.5–5.3)
Sodium: 142 mmol/L (ref 135–146)
Total Bilirubin: 0.3 mg/dL (ref 0.2–1.2)
Total Protein: 7.4 g/dL (ref 6.1–8.1)

## 2021-12-26 LAB — LIPID PANEL
Cholesterol: 187 mg/dL (ref <200)
HDL: 49 mg/dL (ref >=40)
LDL Cholesterol (Calc): 106 mg/dL (calc) — ABNORMAL HIGH
Non-HDL Cholesterol (Calc): 138 mg/dL (calc) — ABNORMAL HIGH (ref <130)
Total CHOL/HDL Ratio: 3.8 (calc) (ref <5.0)
Triglycerides: 197 mg/dL — ABNORMAL HIGH (ref <150)

## 2021-12-26 LAB — CBC
HCT: 43.5 % (ref 38.5–50.0)
Hemoglobin: 14.6 g/dL (ref 13.2–17.1)
MCH: 29.1 pg (ref 27.0–33.0)
MCHC: 33.6 g/dL (ref 32.0–36.0)
MCV: 86.7 fL (ref 80.0–100.0)
MPV: 10.2 fL (ref 7.5–12.5)
Platelets: 286 10*3/uL (ref 140–400)
RBC: 5.02 10*6/uL (ref 4.20–5.80)
RDW: 12.8 % (ref 11.0–15.0)
WBC: 5.3 10*3/uL (ref 3.8–10.8)

## 2021-12-26 LAB — TSH: TSH: 2.93 mIU/L (ref 0.40–4.50)

## 2021-12-27 ENCOUNTER — Ambulatory Visit: Payer: Worker's Compensation | Admitting: Physical Therapy

## 2021-12-27 ENCOUNTER — Other Ambulatory Visit: Payer: Self-pay

## 2021-12-27 DIAGNOSIS — R29898 Other symptoms and signs involving the musculoskeletal system: Secondary | ICD-10-CM

## 2021-12-27 DIAGNOSIS — S82021D Displaced longitudinal fracture of right patella, subsequent encounter for closed fracture with routine healing: Secondary | ICD-10-CM | POA: Diagnosis not present

## 2021-12-27 DIAGNOSIS — R2689 Other abnormalities of gait and mobility: Secondary | ICD-10-CM

## 2021-12-27 DIAGNOSIS — M25661 Stiffness of right knee, not elsewhere classified: Secondary | ICD-10-CM

## 2021-12-27 DIAGNOSIS — M6281 Muscle weakness (generalized): Secondary | ICD-10-CM

## 2021-12-27 NOTE — Therapy (Signed)
[place] [facility] [place], [place], [address] Phone: [phone]   Fax:  [phone]  Physical Therapy Treatment  Patient Details  Name: Nicholas Pearson MRN: [id number] Date of Birth: 02/07/1971 Referring Provider (PT): Dr Marchia Bond   Encounter Date: 12/27/2021   PT End of Session - 12/27/21 1109     Visit Number 8    Number of Visits 20    Date for PT Re-Evaluation 02/08/22    PT Start Time 1101    PT Stop Time 1143    PT Time Calculation (min) 42 min    Activity Tolerance Patient tolerated treatment well    Behavior During Therapy [facility] for tasks assessed/performed             Past Medical History:  Diagnosis Date   Hypertension    Patellar tendon rupture    right    Past Surgical History:  Procedure Laterality Date   KNEE SURGERY     left   PATELLAR TENDON REPAIR Right 10/17/2021   Procedure: RIGHT PATELLA TENDON REPAIR;  Surgeon: Marchia Bond, MD;  Location: [place];  Service: Orthopedics;  Laterality: Right;   WISDOM TOOTH EXTRACTION      There were no vitals filed for this visit.   Subjective Assessment - 12/27/21 1111     Subjective Pt reports he had follow up with surgeon.  He is allowed to open brace up to 60; returns to surgeon in 4 wks.    Patient Stated Goals get back walking to return to work    Currently in Pain? No/denies    Pain Score 0-No pain              [facility] Adult PT Treatment/Exercise - 12/27/21 0001       Self-Care   Self-Care Other Self-Care Comments    Scar Mobilizations pt instructed in patellar mobilizations; pt returned demo with cues.      Blood Flow Restriction-Positions    Blood Flow Restriction Position Standing      BFR Standing   Standing Exercise Pressure (mmHg) 140    Standing Exercise Prescription 30,15,15,15, reps w/ 30-60 sec rest    Standing Exercise Prescription Comment mini squat to 55, then 15 heel raises after  last 30 sec rest.      Knee/Hip Exercises: Stretches   Passive Hamstring Stretch Right;2 reps;30 seconds    Gastroc Stretch Right;2 reps;30 seconds      Knee/Hip Exercises: Standing   SLS Rt SLS x 45 sec with horiz and vertical head turns    Other Standing Knee Exercises staggered stance with gentle knee bends (<60) for work on rolling through foot.      Knee/Hip Exercises: Supine   Quad Sets Strengthening;Right;1 set;10 reps   10 sec hold   Heel Slides AROM;Right;1 set;10 reps   to 60 max   Straight Leg Raises Strengthening;Right;1 set;10 reps   long sitting; with hip abdct/add             PT Short Term Goals - 12/19/21 1313       PT SHORT TERM GOAL #1   Title Evaluation of ROM/strength Rt LE as indicated    Time 4    Period Weeks    Status On-going    Target Date 12/28/21      PT SHORT TERM GOAL #2   Title Increase AROM Rt knee to 0 deg extension; 90 or greater flexion    Time 4  Period Weeks    Status On-going    Target Date 12/28/21      PT SHORT TERM GOAL #3   Title Independent with gait with least restrictive assistive device - weight bearing as allowed by MD    Time 4    Period Weeks    Status Achieved    Target Date 12/28/21      PT SHORT TERM GOAL #4   Title Independent in initial HEP including aquatic exercises as indicated    Time 6    Period Weeks    Status Achieved    Target Date 01/11/22               PT Long Term Goals - 11/30/21 1240       PT LONG TERM GOAL #1   Title Increase AROM Rt knee to WFL's allowing patient to bend knee for all transfers and transitional movements necessary for return to work(RTW)    Time 12    Period Weeks    Status New    Target Date 02/08/22      PT LONG TERM GOAL #2   Title Rt LE strength 4+/5 to 5/5    Time 12    Period Weeks    Status New    Target Date 02/08/22      PT LONG TERM GOAL #3   Title Independent ambulation without assistive device with good gait pattern allowing patient to  ambulate for community and work distances for RTW    Time 12    Period Weeks    Status New    Target Date 02/08/22      PT LONG TERM GOAL #4   Title Independent in HEP including aquatic program prior to discharge    Time 12    Period Weeks    Status New    Target Date 02/08/22      PT LONG TERM GOAL #5   Title Improve functional limitation score to 64    Time 12    Period Weeks    Status New    Target Date 02/08/22                   Plan - 12/27/21 1245     Clinical Impression Statement Pt with good tolerance to standing exercise with BFR.  Exercises complete with up to 60 of Rt knee flexion, per MD guidelines.   No increase in pain with exercises.  Observed to have improved gait pattern while in brace after completion of exercises. Progressing well towards goals as allowed per protocol.    Rehab Potential Good    PT Frequency 2x / week    PT Duration 8 weeks    PT Treatment/Interventions ADLs/Self Care Home Management;Aquatic Therapy;Cryotherapy;Electrical Stimulation;Iontophoresis 75m/ml Dexamethasone;Moist Heat;Ultrasound;Gait training;Stair training;Functional mobility training;Therapeutic activities;Therapeutic exercise;Balance training;Neuromuscular re-education;Patient/family education;Manual techniques;Passive range of motion;Dry needling;Taping;Vasopneumatic Device    PT Next Visit Plan BFR, progress as tolerated, standing therex    PT Home Exercise Plan [id number]    Consulted and Agree with Plan of Care Patient             Patient will benefit from skilled therapeutic intervention in order to improve the following deficits and impairments:  Abnormal gait, Decreased range of motion, Decreased activity tolerance, Decreased balance, Impaired flexibility, Decreased mobility, Decreased strength, Increased edema, Postural dysfunction  Visit Diagnosis: Stiffness of right knee  Muscle weakness (generalized)  Other symptoms and signs involving the  musculoskeletal system  Other abnormalities of  gait and mobility     Problem List Patient Active Problem List   Diagnosis Date Noted   Positive colorectal cancer screening using Cologuard test 12/25/2021   Fracture of patella, right, closed 10/10/2021   Hypertriglyceridemia 05/18/2014   Prediabetes 05/18/2014   Annual physical exam 05/17/2014   LPRD (laryngopharyngeal reflux disease) 04/10/2013   Smoker 12/04/2012   Hypertension 12/04/2012   Left L5 lumbar radiculopathy 11/04/2012   Kerin Perna, PTA 12/27/21 12:51 PM   [place], [place], [address] Phone: [phone]   Fax:  [phone]  Name: Nicholas Pearson MRN: [id number] Date of Birth: 11/27/1971

## 2021-12-29 ENCOUNTER — Other Ambulatory Visit: Payer: Self-pay

## 2021-12-29 ENCOUNTER — Ambulatory Visit: Payer: Worker's Compensation | Admitting: Physical Therapy

## 2021-12-29 DIAGNOSIS — M6281 Muscle weakness (generalized): Secondary | ICD-10-CM

## 2021-12-29 DIAGNOSIS — S82021D Displaced longitudinal fracture of right patella, subsequent encounter for closed fracture with routine healing: Secondary | ICD-10-CM | POA: Diagnosis not present

## 2021-12-29 DIAGNOSIS — R2689 Other abnormalities of gait and mobility: Secondary | ICD-10-CM

## 2021-12-29 DIAGNOSIS — R29898 Other symptoms and signs involving the musculoskeletal system: Secondary | ICD-10-CM

## 2021-12-29 DIAGNOSIS — M25661 Stiffness of right knee, not elsewhere classified: Secondary | ICD-10-CM

## 2021-12-29 NOTE — Therapy (Signed)
[place], [place], [address] Phone: [phone]   Fax:  [phone]  Physical Therapy Treatment  Patient Details  Name: Nicholas Pearson MRN: [id number] Date of Birth: Sep 29, 1971 Referring Provider (PT): Dr Marchia Bond   Encounter Date: 12/29/2021   PT End of Session - 12/29/21 0946     Visit Number 9    Number of Visits 20    Date for PT Re-Evaluation 02/08/22    PT Start Time 0934    PT Stop Time 1014    PT Time Calculation (min) 40 min    Activity Tolerance Patient tolerated treatment well    Behavior During Therapy [facility] for tasks assessed/performed             Past Medical History:  Diagnosis Date   Hypertension    Patellar tendon rupture    right    Past Surgical History:  Procedure Laterality Date   KNEE SURGERY     left   PATELLAR TENDON REPAIR Right 10/17/2021   Procedure: RIGHT PATELLA TENDON REPAIR;  Surgeon: Marchia Bond, MD;  Location: [place];  Service: Orthopedics;  Laterality: Right;   WISDOM TOOTH EXTRACTION      There were no vitals filed for this visit.   Subjective Assessment - 12/29/21 0945     Subjective "I was really sore yesterday"; rated pain at 7/10.  He iced and rested with good resolution of symptoms.    Patient Stated Goals get back walking to return to work    Currently in Pain? No/denies    Pain Score 0-No pain                [facility] PT Assessment - 12/29/21 0001       Assessment   Medical Diagnosis Rt patella tendon repair    Referring Provider (PT) Dr Marchia Bond    Onset Date/Surgical Date 10/06/21   surgery 10/17/21   Hand Dominance Right    Next MD Visit 01/22/22    Prior Therapy during college basketball      Precautions   Required Braces or Orthoses Other Brace/Splint   open to 60             [facility] Adult PT Treatment/Exercise - 12/29/21 0001       BFR Standing   Standing Limb Occulsion Pressure  (mmHg) 267    Standing Exercise Pressure (mmHg) 180   180   Standing Exercise Prescription 30,15,15,15, reps w/ 30-60 sec rest    Standing Exercise Prescription Comment mini squat to 45, then 15 heel raises after last 30 sec rest.      Knee/Hip Exercises: Stretches   Passive Hamstring Stretch Right;2 reps;20 seconds    ITB Stretch Right;2 reps;20 seconds      Knee/Hip Exercises: Standing   Stairs foot tap to 4, 6" step x 1 rep to assess knee flexion (50-55)    SLS Rt SLS on foam x 15 sec x 2    Other Standing Knee Exercises backwards walking with focus on TKE (slow and staying under 60 flexion) x 50 ft.    Other Standing Knee Exercises resisted side stepping with red band at ankles, 20 ft R/L x 2 sets      Knee/Hip Exercises: Seated   Long Arc Quad Right;1 set;10 reps   (in between hamstring isometrics)   Other Seated Knee/Hip Exercises seated Rt hamstring isometrics x 10 sec x 10 reps @ 50 knee flexion  PT Short Term Goals - 12/19/21 1313       PT SHORT TERM GOAL #1   Title Evaluation of ROM/strength Rt LE as indicated    Time 4    Period Weeks    Status On-going    Target Date 12/28/21      PT SHORT TERM GOAL #2   Title Increase AROM Rt knee to 0 deg extension; 90 or greater flexion    Time 4    Period Weeks    Status On-going    Target Date 12/28/21      PT SHORT TERM GOAL #3   Title Independent with gait with least restrictive assistive device - weight bearing as allowed by MD    Time 4    Period Weeks    Status Achieved    Target Date 12/28/21      PT SHORT TERM GOAL #4   Title Independent in initial HEP including aquatic exercises as indicated    Time 6    Period Weeks    Status Achieved    Target Date 01/11/22               PT Long Term Goals - 11/30/21 1240       PT LONG TERM GOAL #1   Title Increase AROM Rt knee to WFL's allowing patient to bend knee for all transfers and transitional movements necessary for return to  work(RTW)    Time 12    Period Weeks    Status New    Target Date 02/08/22      PT LONG TERM GOAL #2   Title Rt LE strength 4+/5 to 5/5    Time 12    Period Weeks    Status New    Target Date 02/08/22      PT LONG TERM GOAL #3   Title Independent ambulation without assistive device with good gait pattern allowing patient to ambulate for community and work distances for RTW    Time 12    Period Weeks    Status New    Target Date 02/08/22      PT LONG TERM GOAL #4   Title Independent in HEP including aquatic program prior to discharge    Time 12    Period Weeks    Status New    Target Date 02/08/22      PT LONG TERM GOAL #5   Title Improve functional limitation score to 64    Time 12    Period Weeks    Status New    Target Date 02/08/22                   Plan - 12/29/21 1015     Clinical Impression Statement Received order from MD to keep knee range 0-60 until next follow up with surgeon.  Pt tolerated standing BFR exercises well, with fatigue but no pain; reduced squat to 45 and encouraged even weight between LEs.  Encouraged pt to elevate LE while icing to assist with edema reduction.  Progressing well towards goals as allowed per protocol.    Rehab Potential Good    PT Frequency 2x / week    PT Duration 8 weeks    PT Treatment/Interventions ADLs/Self Care Home Management;Aquatic Therapy;Cryotherapy;Electrical Stimulation;Iontophoresis 38m/ml Dexamethasone;Moist Heat;Ultrasound;Gait training;Stair training;Functional mobility training;Therapeutic activities;Therapeutic exercise;Balance training;Neuromuscular re-education;Patient/family education;Manual techniques;Passive range of motion;Dry needling;Taping;Vasopneumatic Device    PT Next Visit Plan BFR, progress as tolerated, standing therex; assess STGs.    PT  Home Exercise Plan [id number]    Consulted and Agree with Plan of Care Patient             Patient will benefit from skilled therapeutic  intervention in order to improve the following deficits and impairments:  Abnormal gait, Decreased range of motion, Decreased activity tolerance, Decreased balance, Impaired flexibility, Decreased mobility, Decreased strength, Increased edema, Postural dysfunction  Visit Diagnosis: Stiffness of right knee  Muscle weakness (generalized)  Other symptoms and signs involving the musculoskeletal system  Other abnormalities of gait and mobility     Problem List Patient Active Problem List   Diagnosis Date Noted   Positive colorectal cancer screening using Cologuard test 12/25/2021   Fracture of patella, right, closed 10/10/2021   Hypertriglyceridemia 05/18/2014   Prediabetes 05/18/2014   Annual physical exam 05/17/2014   LPRD (laryngopharyngeal reflux disease) 04/10/2013   Smoker 12/04/2012   Hypertension 12/04/2012   Left L5 lumbar radiculopathy 11/04/2012   Kerin Perna, PTA 12/29/21 10:23 AM  [place], [place], [address] Phone: [phone]   Fax:  [phone]  Name: Nicholas Pearson MRN: [id number] Date of Birth: Oct 19, 1971

## 2022-01-02 ENCOUNTER — Encounter: Payer: 59 | Admitting: Physical Therapy

## 2022-01-03 ENCOUNTER — Other Ambulatory Visit: Payer: Self-pay

## 2022-01-03 ENCOUNTER — Ambulatory Visit: Payer: Worker's Compensation | Admitting: Physical Therapy

## 2022-01-03 DIAGNOSIS — R2689 Other abnormalities of gait and mobility: Secondary | ICD-10-CM

## 2022-01-03 DIAGNOSIS — S82021D Displaced longitudinal fracture of right patella, subsequent encounter for closed fracture with routine healing: Secondary | ICD-10-CM | POA: Diagnosis not present

## 2022-01-03 DIAGNOSIS — M25661 Stiffness of right knee, not elsewhere classified: Secondary | ICD-10-CM

## 2022-01-03 DIAGNOSIS — M6281 Muscle weakness (generalized): Secondary | ICD-10-CM

## 2022-01-03 DIAGNOSIS — R29898 Other symptoms and signs involving the musculoskeletal system: Secondary | ICD-10-CM

## 2022-01-03 NOTE — Therapy (Signed)
[place], [place], [address] Phone: [phone]   Fax:  [phone]  Physical Therapy Treatment  Patient Details  Name: Nicholas Pearson MRN: [id number] Date of Birth: 08/22/1971 Referring Provider (PT): Dr Marchia Bond   Encounter Date: 01/03/2022   PT End of Session - 01/03/22 1435     Visit Number 10    Number of Visits 20    Date for PT Re-Evaluation 02/08/22    PT Start Time 6415    PT Stop Time 1430    PT Time Calculation (min) 45 min    Activity Tolerance Patient tolerated treatment well    Behavior During Therapy [facility] for tasks assessed/performed             Past Medical History:  Diagnosis Date   Hypertension    Patellar tendon rupture    right    Past Surgical History:  Procedure Laterality Date   KNEE SURGERY     left   PATELLAR TENDON REPAIR Right 10/17/2021   Procedure: RIGHT PATELLA TENDON REPAIR;  Surgeon: Marchia Bond, MD;  Location: [place];  Service: Orthopedics;  Laterality: Right;   WISDOM TOOTH EXTRACTION      There were no vitals filed for this visit.   Subjective Assessment - 01/03/22 1345     Subjective Pt states "i feel like I am getting stronger"    Patient Stated Goals get back walking to return to work    Currently in Pain? No/denies                [facility] PT Assessment - 01/03/22 0001       Assessment   Medical Diagnosis Rt patella tendon repair    Referring Provider (PT) Dr Marchia Bond    Onset Date/Surgical Date 10/06/21   surgery 10/17/21   Hand Dominance Right    Next MD Visit 01/22/22    Prior Therapy during college basketball                           [facility] Adult PT Treatment/Exercise - 01/03/22 0001       BFR Standing   Standing Exercise Pressure (mmHg) 180    Standing Exercise Prescription 30,15,15,15, reps w/ 30-60 sec rest    Standing Exercise Prescription Comment mini squat to 45  degrees      Knee/Hip Exercises: Stretches   Passive Hamstring Stretch Right;2 reps;20 seconds    ITB Stretch Right;2 reps;20 seconds      Knee/Hip Exercises: Standing   Forward Step Up Right;20 reps;Hand Hold: 2;Step Height: 6"    Functional Squat Limitations mini squat on BOSU 2 x 10    SLS Rt SLS on foam with ball toss on rebounder 2 x 10    Other Standing Knee Exercises backwards walk 40' x 4 with focus on TKE    Other Standing Knee Exercises sidestep green TB 20' x 4      Knee/Hip Exercises: Seated   Other Seated Knee/Hip Exercises seated HS isometric on red physioball                       PT Short Term Goals - 12/19/21 1313       PT SHORT TERM GOAL #1   Title Evaluation of ROM/strength Rt LE as indicated    Time 4    Period Weeks    Status On-going  Target Date 12/28/21      PT SHORT TERM GOAL #2   Title Increase AROM Rt knee to 0 deg extension; 90 or greater flexion    Time 4    Period Weeks    Status On-going    Target Date 12/28/21      PT SHORT TERM GOAL #3   Title Independent with gait with least restrictive assistive device - weight bearing as allowed by MD    Time 4    Period Weeks    Status Achieved    Target Date 12/28/21      PT SHORT TERM GOAL #4   Title Independent in initial HEP including aquatic exercises as indicated    Time 6    Period Weeks    Status Achieved    Target Date 01/11/22               PT Long Term Goals - 11/30/21 1240       PT LONG TERM GOAL #1   Title Increase AROM Rt knee to WFL's allowing patient to bend knee for all transfers and transitional movements necessary for return to work(RTW)    Time 12    Period Weeks    Status New    Target Date 02/08/22      PT LONG TERM GOAL #2   Title Rt LE strength 4+/5 to 5/5    Time 12    Period Weeks    Status New    Target Date 02/08/22      PT LONG TERM GOAL #3   Title Independent ambulation without assistive device with good gait pattern allowing  patient to ambulate for community and work distances for RTW    Time 12    Period Weeks    Status New    Target Date 02/08/22      PT LONG TERM GOAL #4   Title Independent in HEP including aquatic program prior to discharge    Time 12    Period Weeks    Status New    Target Date 02/08/22      PT LONG TERM GOAL #5   Title Improve functional limitation score to 64    Time 12    Period Weeks    Status New    Target Date 02/08/22                   Plan - 01/03/22 1435     Clinical Impression Statement Pt progressing well within restrictions. Improving balance and able to perform SLS on foam with rebounder ball toss today. PT continues to encourage ice and elevation to decrease swelling.    PT Next Visit Plan progress standing and balance as tolerated    PT Home Exercise Plan [id number]    Consulted and Agree with Plan of Care Patient             Patient will benefit from skilled therapeutic intervention in order to improve the following deficits and impairments:     Visit Diagnosis: Stiffness of right knee  Muscle weakness (generalized)  Other symptoms and signs involving the musculoskeletal system  Other abnormalities of gait and mobility     Problem List Patient Active Problem List   Diagnosis Date Noted   Positive colorectal cancer screening using Cologuard test 12/25/2021   Fracture of patella, right, closed 10/10/2021   Hypertriglyceridemia 05/18/2014   Prediabetes 05/18/2014   Annual physical exam 05/17/2014   LPRD (laryngopharyngeal reflux disease) 04/10/2013  Smoker 12/04/2012   Hypertension 12/04/2012   Left L5 lumbar radiculopathy 11/04/2012    Reema Chick, PT 01/03/2022, 2:37 PM  [facility] [place], [place], [address] Phone: [phone]   Fax:  [phone]  Name: EMILLIO NGO MRN: [id number] Date of Birth: 1971/05/17

## 2022-01-05 ENCOUNTER — Other Ambulatory Visit: Payer: Self-pay

## 2022-01-05 ENCOUNTER — Ambulatory Visit: Payer: Worker's Compensation | Admitting: Physical Therapy

## 2022-01-05 DIAGNOSIS — R29898 Other symptoms and signs involving the musculoskeletal system: Secondary | ICD-10-CM

## 2022-01-05 DIAGNOSIS — S82021D Displaced longitudinal fracture of right patella, subsequent encounter for closed fracture with routine healing: Secondary | ICD-10-CM | POA: Diagnosis not present

## 2022-01-05 DIAGNOSIS — R2689 Other abnormalities of gait and mobility: Secondary | ICD-10-CM

## 2022-01-05 DIAGNOSIS — M25661 Stiffness of right knee, not elsewhere classified: Secondary | ICD-10-CM

## 2022-01-05 DIAGNOSIS — M6281 Muscle weakness (generalized): Secondary | ICD-10-CM

## 2022-01-05 NOTE — Therapy (Signed)
[place], [place], [address] Phone: [phone]   Fax:  [phone]  Physical Therapy Treatment  Patient Details  Name: Nicholas Pearson MRN: [id number] Date of Birth: [birth year] Referring Provider (PT): Dr Marchia Bond   Encounter Date: 01/05/2022   PT End of Session - 01/05/22 1135     Visit Number 11    Number of Visits 20    Date for PT Re-Evaluation 02/08/22    PT Start Time 1100    PT Stop Time 1140    PT Time Calculation (min) 40 min    Activity Tolerance Patient tolerated treatment well    Behavior During Therapy [facility] for tasks assessed/performed             Past Medical History:  Diagnosis Date   Hypertension    Patellar tendon rupture    right    Past Surgical History:  Procedure Laterality Date   KNEE SURGERY     left   PATELLAR TENDON REPAIR Right 10/17/2021   Procedure: RIGHT PATELLA TENDON REPAIR;  Surgeon: Marchia Bond, MD;  Location: [place];  Service: Orthopedics;  Laterality: Right;   WISDOM TOOTH EXTRACTION      There were no vitals filed for this visit.   Subjective Assessment - 01/05/22 1103     Subjective Pt states he has been "working it" at home. He still feels "Stiff" but stronger    Patient Stated Goals get back walking to return to work    Currently in Pain? No/denies                [facility] PT Assessment - 01/05/22 0001       Assessment   Medical Diagnosis Rt patella tendon repair    Referring Provider (PT) Dr Marchia Bond    Onset Date/Surgical Date 10/06/21   surgery 10/17/21   Hand Dominance Right    Next MD Visit 01/22/22    Prior Therapy during college basketball      AROM   Overall AROM Comments pt able to achieve 60 degrees Rt knee flexion as allowed per protocol                           [facility] Adult PT Treatment/Exercise - 01/05/22 0001       BFR Standing   Standing Exercise Pressure (mmHg)  180    Standing Exercise Prescription 30,15,15,15, reps w/ 30-60 sec rest    Standing Exercise Prescription Comment mini squat to 45 degrees      Knee/Hip Exercises: Standing   Heel Raises 20 reps    Heel Raises Limitations holding 8# bilat    Lateral Step Up Right;Hand Hold: 1;Step Height: 6";10 reps    Forward Step Up Right    Forward Step Up Limitations 6'' step ups x 2 min for warm up    Functional Squat Limitations mini squat x 20 holding 8# bilat    SLS Rt SLS on foam with ball toss on rebounder 2 x 10    SLS with Vectors SLS diver to chair height x 10    Other Standing Knee Exercises sidestep green TB 40' x 2, monster walk green TB 40'      Knee/Hip Exercises: Supine   Bridges 20 reps    Bridges Limitations wiht LEs on physioball      Knee/Hip Exercises: Prone   Other Prone Exercises plank 3 x 20 sec  PT Short Term Goals - 12/19/21 1313       PT SHORT TERM GOAL #1   Title Evaluation of ROM/strength Rt LE as indicated    Time 4    Period Weeks    Status On-going    Target Date 12/28/21      PT SHORT TERM GOAL #2   Title Increase AROM Rt knee to 0 deg extension; 90 or greater flexion    Time 4    Period Weeks    Status On-going    Target Date 12/28/21      PT SHORT TERM GOAL #3   Title Independent with gait with least restrictive assistive device - weight bearing as allowed by MD    Time 4    Period Weeks    Status Achieved    Target Date 12/28/21      PT SHORT TERM GOAL #4   Title Independent in initial HEP including aquatic exercises as indicated    Time 6    Period Weeks    Status Achieved    Target Date 01/11/22               PT Long Term Goals - 11/30/21 1240       PT LONG TERM GOAL #1   Title Increase AROM Rt knee to WFL's allowing patient to bend knee for all transfers and transitional movements necessary for return to work(RTW)    Time 12    Period Weeks    Status New    Target Date 02/08/22      PT  LONG TERM GOAL #2   Title Rt LE strength 4+/5 to 5/5    Time 12    Period Weeks    Status New    Target Date 02/08/22      PT LONG TERM GOAL #3   Title Independent ambulation without assistive device with good gait pattern allowing patient to ambulate for community and work distances for RTW    Time 12    Period Weeks    Status New    Target Date 02/08/22      PT LONG TERM GOAL #4   Title Independent in HEP including aquatic program prior to discharge    Time 12    Period Weeks    Status New    Target Date 02/08/22      PT LONG TERM GOAL #5   Title Improve functional limitation score to 64    Time 12    Period Weeks    Status New    Target Date 02/08/22                   Plan - 01/05/22 1135     Clinical Impression Statement Pt progressing well. Able to add planks and straight leg bridges with no increase in pain    PT Next Visit Plan progress standing and balance as tolerated    PT Home Exercise Plan [id number]    Consulted and Agree with Plan of Care Patient             Patient will benefit from skilled therapeutic intervention in order to improve the following deficits and impairments:     Visit Diagnosis: Stiffness of right knee  Muscle weakness (generalized)  Other symptoms and signs involving the musculoskeletal system  Other abnormalities of gait and mobility     Problem List Patient Active Problem List   Diagnosis Date Noted   Positive colorectal cancer screening using  Cologuard test 12/25/2021   Fracture of patella, right, closed 10/10/2021   Hypertriglyceridemia 05/18/2014   Prediabetes 05/18/2014   Annual physical exam 05/17/2014   LPRD (laryngopharyngeal reflux disease) 04/10/2013   Smoker 12/04/2012   Hypertension 12/04/2012   Left L5 lumbar radiculopathy 11/04/2012    Caelie R[place], PT 01/05/2022, 11:49 AM  [facility] [place], [place],  [address] Phone: [phone]   Fax:  [phone]  Name: KAMEN HANKEN MRN: [id number] Date of Birth: [birth year]

## 2022-01-09 ENCOUNTER — Encounter: Payer: 59 | Admitting: Physical Therapy

## 2022-01-10 ENCOUNTER — Encounter: Payer: Self-pay | Admitting: Physical Therapy

## 2022-01-10 ENCOUNTER — Ambulatory Visit: Payer: Worker's Compensation | Admitting: Physical Therapy

## 2022-01-10 ENCOUNTER — Other Ambulatory Visit: Payer: Self-pay

## 2022-01-10 DIAGNOSIS — S82021D Displaced longitudinal fracture of right patella, subsequent encounter for closed fracture with routine healing: Secondary | ICD-10-CM | POA: Diagnosis not present

## 2022-01-10 DIAGNOSIS — M25661 Stiffness of right knee, not elsewhere classified: Secondary | ICD-10-CM

## 2022-01-10 DIAGNOSIS — R29898 Other symptoms and signs involving the musculoskeletal system: Secondary | ICD-10-CM

## 2022-01-10 DIAGNOSIS — M6281 Muscle weakness (generalized): Secondary | ICD-10-CM

## 2022-01-10 NOTE — Therapy (Signed)
[place], [place], [address] Phone: [phone]   Fax:  [phone]  Physical Therapy Treatment  Patient Details  Name: Nicholas Pearson MRN: [id number] Date of Birth: 1971-08-07 Referring Provider (PT): Dr Marchia Bond   Encounter Date: 01/10/2022   PT End of Session - 01/10/22 1357     Visit Number 12    Number of Visits 20    Date for PT Re-Evaluation 02/08/22    PT Start Time 4818    PT Stop Time 1430    PT Time Calculation (min) 42 min    Activity Tolerance Patient tolerated treatment well    Behavior During Therapy [facility] for tasks assessed/performed             Past Medical History:  Diagnosis Date   Hypertension    Patellar tendon rupture    right    Past Surgical History:  Procedure Laterality Date   KNEE SURGERY     left   PATELLAR TENDON REPAIR Right 10/17/2021   Procedure: RIGHT PATELLA TENDON REPAIR;  Surgeon: Marchia Bond, MD;  Location: [place];  Service: Orthopedics;  Laterality: Right;   WISDOM TOOTH EXTRACTION      There were no vitals filed for this visit.   Subjective Assessment - 01/10/22 1353     Subjective "It (Rt knee) feels good; I work it every day".    Patient Stated Goals get back walking to return to work    Currently in Pain? No/denies    Pain Score 0-No pain                [facility] PT Assessment - 01/10/22 0001       Assessment   Medical Diagnosis Rt patella tendon repair    Referring Provider (PT) Dr Marchia Bond    Onset Date/Surgical Date 10/06/21   surgery 10/17/21   Hand Dominance Right    Next MD Visit 01/22/22    Prior Therapy during college basketball              [facility] Adult PT Treatment/Exercise - 01/10/22 0001       Blood Flow Restriction-Positions    Blood Flow Restriction Position Standing      BFR Standing   Standing Exercise Pressure (mmHg) 180    Standing Exercise Prescription 30,15,15,15,  reps w/ 30-60 sec rest    Standing Exercise Prescription Comment mini squat to 45-50 with Lt foot on yoga block      Knee/Hip Exercises: Standing   Heel Raises 20 reps   eccentric lowering for 4 sec with heels off of step   Hip ADduction Strengthening;Right;1 set;15 reps   green band   Lateral Step Up Right;1 set;15 reps;Step Height: 6"    Forward Step Up Right;1 set;10 reps;Step Height: 6"   for warm up   Step Down Left;1 set;15 reps;Step Height: 4"   and retro step up with TKE   SLS with Vectors Rt SLS diver to chair height x 10, 5 reps Lt - cues for form.   brace on     Knee/Hip Exercises: Sidelying   Other Sidelying Knee/Hip Exercises side plank x 20 sec each side (on forearm, LE straight)            Pt shown SLR with ER lifting LE over yoga block for HEP.       PT Short Term Goals - 12/19/21 1313       PT SHORT  TERM GOAL #1   Title Evaluation of ROM/strength Rt LE as indicated    Time 4    Period Weeks    Status On-going    Target Date 12/28/21      PT SHORT TERM GOAL #2   Title Increase AROM Rt knee to 0 deg extension; 90 or greater flexion    Time 4    Period Weeks    Status On-going    Target Date 12/28/21      PT SHORT TERM GOAL #3   Title Independent with gait with least restrictive assistive device - weight bearing as allowed by MD    Time 4    Period Weeks    Status Achieved    Target Date 12/28/21      PT SHORT TERM GOAL #4   Title Independent in initial HEP including aquatic exercises as indicated    Time 6    Period Weeks    Status Achieved    Target Date 01/11/22               PT Long Term Goals - 11/30/21 1240       PT LONG TERM GOAL #1   Title Increase AROM Rt knee to WFL's allowing patient to bend knee for all transfers and transitional movements necessary for return to work(RTW)    Time 12    Period Weeks    Status New    Target Date 02/08/22      PT LONG TERM GOAL #2   Title Rt LE strength 4+/5 to 5/5    Time 12     Period Weeks    Status New    Target Date 02/08/22      PT LONG TERM GOAL #3   Title Independent ambulation without assistive device with good gait pattern allowing patient to ambulate for community and work distances for RTW    Time 12    Period Weeks    Status New    Target Date 02/08/22      PT LONG TERM GOAL #4   Title Independent in HEP including aquatic program prior to discharge    Time 12    Period Weeks    Status New    Target Date 02/08/22      PT LONG TERM GOAL #5   Title Improve functional limitation score to 64    Time 12    Period Weeks    Status New    Target Date 02/08/22                   Plan - 01/10/22 1425     Clinical Impression Statement Pt tolerated new exercises well, without increase in symptoms.  All exercises completed within 60 knee flexion restriction. Able to complete BFR set with increased weight through RLE with mini squat.   Progressing gradually towards LTGs    PT Frequency 2x / week    PT Duration 8 weeks    PT Treatment/Interventions ADLs/Self Care Home Management;Aquatic Therapy;Cryotherapy;Electrical Stimulation;Iontophoresis 17m/ml Dexamethasone;Moist Heat;Ultrasound;Gait training;Stair training;Functional mobility training;Therapeutic activities;Therapeutic exercise;Balance training;Neuromuscular re-education;Patient/family education;Manual techniques;Passive range of motion;Dry needling;Taping;Vasopneumatic Device    PT Next Visit Plan progress standing and balance as tolerated    PT Home Exercise Plan [id number]    Consulted and Agree with Plan of Care Patient             Patient will benefit from skilled therapeutic intervention in order to improve the following deficits and impairments:  Abnormal  gait, Decreased range of motion, Decreased activity tolerance, Decreased balance, Impaired flexibility, Decreased mobility, Decreased strength, Increased edema, Postural dysfunction  Visit Diagnosis: Stiffness of right  knee  Muscle weakness (generalized)  Other symptoms and signs involving the musculoskeletal system     Problem List Patient Active Problem List   Diagnosis Date Noted   Positive colorectal cancer screening using Cologuard test 12/25/2021   Fracture of patella, right, closed 10/10/2021   Hypertriglyceridemia 05/18/2014   Prediabetes 05/18/2014   Annual physical exam 05/17/2014   LPRD (laryngopharyngeal reflux disease) 04/10/2013   Smoker 12/04/2012   Hypertension 12/04/2012   Left L5 lumbar radiculopathy 11/04/2012   Kerin Perna, PTA 01/10/22 2:38 PM   [place], [place], [address] Phone: [phone]   Fax:  [phone]  Name: TAMARCUS CONDIE MRN: [id number] Date of Birth: 1971-01-27

## 2022-01-12 ENCOUNTER — Ambulatory Visit: Payer: Worker's Compensation | Admitting: Physical Therapy

## 2022-01-12 ENCOUNTER — Other Ambulatory Visit: Payer: Self-pay

## 2022-01-12 DIAGNOSIS — R2689 Other abnormalities of gait and mobility: Secondary | ICD-10-CM

## 2022-01-12 DIAGNOSIS — M6281 Muscle weakness (generalized): Secondary | ICD-10-CM

## 2022-01-12 DIAGNOSIS — R29898 Other symptoms and signs involving the musculoskeletal system: Secondary | ICD-10-CM

## 2022-01-12 DIAGNOSIS — M25661 Stiffness of right knee, not elsewhere classified: Secondary | ICD-10-CM

## 2022-01-12 DIAGNOSIS — S82021D Displaced longitudinal fracture of right patella, subsequent encounter for closed fracture with routine healing: Secondary | ICD-10-CM | POA: Diagnosis not present

## 2022-01-12 NOTE — Therapy (Signed)
[place], [place], [address] Phone: [phone]   Fax:  [phone]  Physical Therapy Treatment  Patient Details  Name: Nicholas Pearson MRN: [id number] Date of Birth: [birth year] Referring Provider (PT): Dr Marchia Bond   Encounter Date: 01/12/2022   PT End of Session - 01/12/22 1100     Visit Number 13    Number of Visits 20    Date for PT Re-Evaluation 02/08/22    PT Start Time 0263    PT Stop Time 1057    PT Time Calculation (min) 42 min    Activity Tolerance Patient tolerated treatment well    Behavior During Therapy [facility] for tasks assessed/performed             Past Medical History:  Diagnosis Date   Hypertension    Patellar tendon rupture    right    Past Surgical History:  Procedure Laterality Date   KNEE SURGERY     left   PATELLAR TENDON REPAIR Right 10/17/2021   Procedure: RIGHT PATELLA TENDON REPAIR;  Surgeon: Marchia Bond, MD;  Location: [place];  Service: Orthopedics;  Laterality: Right;   WISDOM TOOTH EXTRACTION      There were no vitals filed for this visit.   Subjective Assessment - 01/12/22 1018     Subjective "my knee feels good but i strained my back". pt states he pulled a muscle in his back yesterday but is feeling better    Patient Stated Goals get back walking to return to work    Currently in Pain? No/denies                [facility] PT Assessment - 01/12/22 0001       Assessment   Medical Diagnosis Rt patella tendon repair    Referring Provider (PT) Dr Marchia Bond    Onset Date/Surgical Date 10/06/21   surgery 10/17/21   Hand Dominance Right    Next MD Visit 01/22/22    Prior Therapy during college basketball      AROM   Overall AROM Comments able to achieve full 60 degrees knee flexion allowed per protocol                           [facility] Adult PT Treatment/Exercise - 01/12/22 0001       BFR Standing    Standing Exercise Pressure (mmHg) 180    Standing Exercise Prescription 30,15,15,15, reps w/ 30-60 sec rest    Standing Exercise Prescription Comment mini squat to 45-50 with Lt foot on yoga block      Knee/Hip Exercises: Standing   Heel Raises 20 reps    Heel Raises Limitations holding 8# bilat    Lateral Step Up Right;15 reps;Hand Hold: 0;Step Height: 6"    Forward Step Up --   runners step up x 15 8'' step without UE support   Forward Step Up Limitations 6'' step ups x 2 min for warm up    Step Down Left;1 set;15 reps;Step Height: 4"   and retro step up with TKE   SLS with Vectors SLS diver to chair x 10    Other Standing Knee Exercises mini squat with Lt foot on yoga block x 10    Other Standing Knee Exercises sidestep green TB 40' x 2      Knee/Hip Exercises: Supine   Bridges 20 reps    Bridges Limitations with  LEs on physioball    Straight Leg Raise with External Rotation 10 reps;2 sets    Straight Leg Raise with External Rotation Limitations over yoga block                       PT Short Term Goals - 12/19/21 1313       PT SHORT TERM GOAL #1   Title Evaluation of ROM/strength Rt LE as indicated    Time 4    Period Weeks    Status On-going    Target Date 12/28/21      PT SHORT TERM GOAL #2   Title Increase AROM Rt knee to 0 deg extension; 90 or greater flexion    Time 4    Period Weeks    Status On-going    Target Date 12/28/21      PT SHORT TERM GOAL #3   Title Independent with gait with least restrictive assistive device - weight bearing as allowed by MD    Time 4    Period Weeks    Status Achieved    Target Date 12/28/21      PT SHORT TERM GOAL #4   Title Independent in initial HEP including aquatic exercises as indicated    Time 6    Period Weeks    Status Achieved    Target Date 01/11/22               PT Long Term Goals - 11/30/21 1240       PT LONG TERM GOAL #1   Title Increase AROM Rt knee to WFL's allowing patient to bend  knee for all transfers and transitional movements necessary for return to work(RTW)    Time 12    Period Weeks    Status New    Target Date 02/08/22      PT LONG TERM GOAL #2   Title Rt LE strength 4+/5 to 5/5    Time 12    Period Weeks    Status New    Target Date 02/08/22      PT LONG TERM GOAL #3   Title Independent ambulation without assistive device with good gait pattern allowing patient to ambulate for community and work distances for RTW    Time 12    Period Weeks    Status New    Target Date 02/08/22      PT LONG TERM GOAL #4   Title Independent in HEP including aquatic program prior to discharge    Time 12    Period Weeks    Status New    Target Date 02/08/22      PT LONG TERM GOAL #5   Title Improve functional limitation score to 64    Time 12    Period Weeks    Status New    Target Date 02/08/22                   Plan - 01/12/22 1100     Clinical Impression Statement Pt continues with good tolerance of strength and balance exercises. Able to add runners step up today and pt demo'd improved balance with diver to chair height    PT Next Visit Plan diver to lower surface or add weight, balance and strengthening as tolerated    PT Home Exercise Plan [id number]    Consulted and Agree with Plan of Care Patient             Patient  will benefit from skilled therapeutic intervention in order to improve the following deficits and impairments:     Visit Diagnosis: Stiffness of right knee  Other symptoms and signs involving the musculoskeletal system  Muscle weakness (generalized)  Other abnormalities of gait and mobility     Problem List Patient Active Problem List   Diagnosis Date Noted   Positive colorectal cancer screening using Cologuard test 12/25/2021   Fracture of patella, right, closed 10/10/2021   Hypertriglyceridemia 05/18/2014   Prediabetes 05/18/2014   Annual physical exam 05/17/2014   LPRD (laryngopharyngeal reflux disease)  04/10/2013   Smoker 12/04/2012   Hypertension 12/04/2012   Left L5 lumbar radiculopathy 11/04/2012    Abrie Egloff, PT 01/12/2022, 11:01 AM  [facility] [place], [place], [address] Phone: [phone]   Fax:  [phone]  Name: RONN SMOLINSKY MRN: [id number] Date of Birth: [birth year]

## 2022-01-17 ENCOUNTER — Ambulatory Visit: Payer: Worker's Compensation | Attending: Orthopedic Surgery | Admitting: Physical Therapy

## 2022-01-17 ENCOUNTER — Other Ambulatory Visit: Payer: Self-pay

## 2022-01-17 DIAGNOSIS — M6281 Muscle weakness (generalized): Secondary | ICD-10-CM | POA: Diagnosis present

## 2022-01-17 DIAGNOSIS — M25661 Stiffness of right knee, not elsewhere classified: Secondary | ICD-10-CM | POA: Diagnosis present

## 2022-01-17 DIAGNOSIS — R2689 Other abnormalities of gait and mobility: Secondary | ICD-10-CM | POA: Insufficient documentation

## 2022-01-17 DIAGNOSIS — R29898 Other symptoms and signs involving the musculoskeletal system: Secondary | ICD-10-CM | POA: Diagnosis present

## 2022-01-17 NOTE — Therapy (Signed)
[place], [place], [address] Phone: [phone]   Fax:  [phone]  Physical Therapy Treatment  Patient Details  Name: Nicholas Pearson MRN: [id number] Date of Birth: 05/16/1971 Referring Provider (PT): Dr Marchia Bond   Encounter Date: 01/17/2022   PT End of Session - 01/17/22 1526     Visit Number 14    Number of Visits 20    Date for PT Re-Evaluation 02/08/22    PT Start Time 1430    PT Stop Time 1515    PT Time Calculation (min) 45 min    Activity Tolerance Patient tolerated treatment well    Behavior During Therapy [facility] for tasks assessed/performed             Past Medical History:  Diagnosis Date   Hypertension    Patellar tendon rupture    right    Past Surgical History:  Procedure Laterality Date   KNEE SURGERY     left   PATELLAR TENDON REPAIR Right 10/17/2021   Procedure: RIGHT PATELLA TENDON REPAIR;  Surgeon: Marchia Bond, MD;  Location: [place];  Service: Orthopedics;  Laterality: Right;   WISDOM TOOTH EXTRACTION      There were no vitals filed for this visit.   Subjective Assessment - 01/17/22 1434     Subjective Pt states he is feeling "good"    Patient Stated Goals get back walking to return to work    Currently in Pain? No/denies                [facility] PT Assessment - 01/17/22 0001       Assessment   Medical Diagnosis Rt patella tendon repair    Referring Provider (PT) Dr Marchia Bond    Onset Date/Surgical Date 10/06/21   surgery 10/17/21   Hand Dominance Right    Next MD Visit 01/22/22    Prior Therapy during college basketball      AROM   Overall AROM Comments able to achieve full 60 degrees knee flexion allowed per protocol                           [facility] Adult PT Treatment/Exercise - 01/17/22 0001       BFR Standing   Standing Exercise Pressure (mmHg) 180    Standing Exercise Prescription 30,15,15,15,  reps w/ 30-60 sec rest    Standing Exercise Prescription Comment mini squat to 45-50 with Lt foot on yoga block      Knee/Hip Exercises: Standing   Heel Raises --   30 reps   Heel Raises Limitations holding 8# bilat    Lateral Step Up Limitations stradle step up 8'' x 20    Forward Step Up --   runners step up 8'' with blue med ball x 20   Step Down Left;1 set;15 reps;Step Height: 4"   and retro step up with TKE   SLS with Vectors SLS diver to 12'' stool x 10    Other Standing Knee Exercises sled push/pull 50# x 5 laps for warm up with brace on    Other Standing Knee Exercises sidestep blue TB 40' x 2      Knee/Hip Exercises: Supine   Bridges 20 reps    Bridges Limitations with LEs on physioball                       PT Short  Term Goals - 12/19/21 1313       PT SHORT TERM GOAL #1   Title Evaluation of ROM/strength Rt LE as indicated    Time 4    Period Weeks    Status On-going    Target Date 12/28/21      PT SHORT TERM GOAL #2   Title Increase AROM Rt knee to 0 deg extension; 90 or greater flexion    Time 4    Period Weeks    Status On-going    Target Date 12/28/21      PT SHORT TERM GOAL #3   Title Independent with gait with least restrictive assistive device - weight bearing as allowed by MD    Time 4    Period Weeks    Status Achieved    Target Date 12/28/21      PT SHORT TERM GOAL #4   Title Independent in initial HEP including aquatic exercises as indicated    Time 6    Period Weeks    Status Achieved    Target Date 01/11/22               PT Long Term Goals - 11/30/21 1240       PT LONG TERM GOAL #1   Title Increase AROM Rt knee to WFL's allowing patient to bend knee for all transfers and transitional movements necessary for return to work(RTW)    Time 12    Period Weeks    Status New    Target Date 02/08/22      PT LONG TERM GOAL #2   Title Rt LE strength 4+/5 to 5/5    Time 12    Period Weeks    Status New    Target Date  02/08/22      PT LONG TERM GOAL #3   Title Independent ambulation without assistive device with good gait pattern allowing patient to ambulate for community and work distances for RTW    Time 12    Period Weeks    Status New    Target Date 02/08/22      PT LONG TERM GOAL #4   Title Independent in HEP including aquatic program prior to discharge    Time 12    Period Weeks    Status New    Target Date 02/08/22      PT LONG TERM GOAL #5   Title Improve functional limitation score to 64    Time 12    Period Weeks    Status New    Target Date 02/08/22                   Plan - 01/17/22 1527     Clinical Impression Statement Pt continues to progress well with physical therapy. He has improved his balance, strength and coordination. Pt continues to work hard on HEP and is progressing towards goals of care    PT Next Visit Plan how was MD appointment?  progress as protocol allows    PT Home Exercise Plan [id number]    Consulted and Agree with Plan of Care Patient             Patient will benefit from skilled therapeutic intervention in order to improve the following deficits and impairments:     Visit Diagnosis: Stiffness of right knee  Other symptoms and signs involving the musculoskeletal system  Muscle weakness (generalized)  Other abnormalities of gait and mobility     Problem List Patient  Active Problem List   Diagnosis Date Noted   Positive colorectal cancer screening using Cologuard test 12/25/2021   Fracture of patella, right, closed 10/10/2021   Hypertriglyceridemia 05/18/2014   Prediabetes 05/18/2014   Annual physical exam 05/17/2014   LPRD (laryngopharyngeal reflux disease) 04/10/2013   Smoker 12/04/2012   Hypertension 12/04/2012   Left L5 lumbar radiculopathy 11/04/2012    Ellerie Arenz, PT 01/17/2022, 3:28 PM  [facility] [place], [place], [address] Phone:  [phone]   Fax:  [phone]  Name: EVARISTO TSUDA MRN: [id number] Date of Birth: 1971-04-06

## 2022-01-19 ENCOUNTER — Ambulatory Visit: Payer: Worker's Compensation | Admitting: Physical Therapy

## 2022-01-24 ENCOUNTER — Other Ambulatory Visit: Payer: Self-pay

## 2022-01-24 ENCOUNTER — Encounter: Payer: Self-pay | Admitting: Physical Therapy

## 2022-01-24 ENCOUNTER — Ambulatory Visit: Payer: Worker's Compensation | Admitting: Physical Therapy

## 2022-01-24 DIAGNOSIS — M6281 Muscle weakness (generalized): Secondary | ICD-10-CM

## 2022-01-24 DIAGNOSIS — R29898 Other symptoms and signs involving the musculoskeletal system: Secondary | ICD-10-CM

## 2022-01-24 DIAGNOSIS — R2689 Other abnormalities of gait and mobility: Secondary | ICD-10-CM

## 2022-01-24 DIAGNOSIS — M25661 Stiffness of right knee, not elsewhere classified: Secondary | ICD-10-CM | POA: Diagnosis not present

## 2022-01-24 NOTE — Therapy (Signed)
[place], [place], [address] Phone: [phone]   Fax:  [phone]  Physical Therapy Treatment  Patient Details  Name: Nicholas Pearson MRN: [id number] Date of Birth: 1971-06-19 Referring Provider (PT): Dr Marchia Bond   Encounter Date: 01/24/2022   PT End of Session - 01/24/22 1425     Visit Number 15    Number of Visits 20    Date for PT Re-Evaluation 02/08/22    PT Start Time 1352   pt arrived late   PT Stop Time 1434    PT Time Calculation (min) 42 min    Activity Tolerance Patient tolerated treatment well    Behavior During Therapy [facility] for tasks assessed/performed             Past Medical History:  Diagnosis Date   Hypertension    Patellar tendon rupture    right    Past Surgical History:  Procedure Laterality Date   KNEE SURGERY     left   PATELLAR TENDON REPAIR Right 10/17/2021   Procedure: RIGHT PATELLA TENDON REPAIR;  Surgeon: Marchia Bond, MD;  Location: [place];  Service: Orthopedics;  Laterality: Right;   WISDOM TOOTH EXTRACTION      There were no vitals filed for this visit.   Subjective Assessment - 01/24/22 1401     Subjective Pt reports he went to surgeon.  He states that his brace was opened up all the way and that he was told the brace is now optional.  He reports he is supposed to gradually progress his ROM.    Currently in Pain? Yes    Pain Score 5     Pain Location Knee    Pain Orientation Right    Pain Descriptors / Indicators Aching;Dull;Sore    Aggravating Factors  prolonged sitting in car    Pain Relieving Factors ice                [facility] PT Assessment - 01/24/22 0001       Assessment   Medical Diagnosis Rt patella tendon repair    Referring Provider (PT) Dr Marchia Bond    Onset Date/Surgical Date 10/06/21   surgery 10/17/21   Hand Dominance Right    Next MD Visit 02/19/22    Prior Therapy during college basketball       ROM / Strength   AROM / PROM / Strength PROM      AROM   AROM Assessment Site Knee    Right/Left Knee Right    Right Knee Extension 0      PROM   PROM Assessment Site Knee    Right/Left Knee Right    Right Knee Flexion 75               [facility] Adult PT Treatment/Exercise - 01/24/22 0001       Ambulation/Gait   Ambulation/Gait Assistance 7: Independent    Ambulation Distance (Feet) 100 Feet    Assistive device None    Gait Pattern Step-through pattern;Decreased stance time - right;Decreased hip/knee flexion - right;Decreased dorsiflexion - right;Decreased weight shift to right;Wide base of support    Pre-Gait Activities marching -> high knee marching with cues for even weight shift and stance time.      Knee/Hip Exercises: Stretches   Passive Hamstring Stretch Right;4 reps;20 seconds   standing with leg on step, chair, and seated with hip hinge.   Quad Stretch 3 reps;30 seconds  prone with towel above knee   Gastroc Stretch Right;3 reps;20 seconds    Soleus Stretch Right;2 reps;20 seconds      Knee/Hip Exercises: Aerobic   Recumbent Bike partial revolutions for ROM x 5 min      Knee/Hip Exercises: Standing   Knee Flexion AROM;Right;1 set;10 reps   (hamstring curl)   Functional Squat 1 set;10 reps   knee flexion to 74   Functional Squat Limitations buttocks touching elevated table    Other Standing Knee Exercises ROM exercises:  forward lunge with foot on 12" step x 10 sec x 5, then on foot on chair  x 10 sec x 5.      Knee/Hip Exercises: Seated   Other Seated Knee/Hip Exercises seated scoots x 3 reps of 15 sec hold.                     PT Education - 01/24/22 1646     Education Details HEP updated.    Person(s) Educated Patient    Methods Explanation;Demonstration;Tactile cues;Verbal cues;Handout    Comprehension Verbalized understanding;Returned demonstration              PT Short Term Goals - 01/24/22 1630       PT SHORT TERM GOAL #1    Title Evaluation of ROM/strength Rt LE as indicated    Time 4    Period Weeks    Status On-going    Target Date 12/28/21      PT SHORT TERM GOAL #2   Title Increase AROM Rt knee to 0 deg extension; 90 or greater flexion    Time 4    Period Weeks    Status Partially Met    Target Date 12/28/21      PT SHORT TERM GOAL #3   Title Independent with gait with least restrictive assistive device - weight bearing as allowed by MD    Time 4    Period Weeks    Status Achieved    Target Date 12/28/21      PT SHORT TERM GOAL #4   Title Independent in initial HEP including aquatic exercises as indicated    Time 6    Period Weeks    Status Achieved    Target Date 01/11/22               PT Long Term Goals - 01/24/22 1629       PT LONG TERM GOAL #1   Title Increase AROM Rt knee to WFL's allowing patient to bend knee for all transfers and transitional movements necessary for return to work(RTW)    Time 12    Period Weeks    Status On-going    Target Date 02/08/22      PT LONG TERM GOAL #2   Title Rt LE strength 4+/5 to 5/5    Time 12    Period Weeks    Status Partially Met    Target Date 02/08/22      PT LONG TERM GOAL #3   Title Independent ambulation without assistive device with good gait pattern allowing patient to ambulate for community and work distances for RTW    Time 12    Period Weeks    Status Partially Met    Target Date 02/08/22      PT LONG TERM GOAL #4   Title Independent in HEP including aquatic program prior to discharge    Time 12    Period Weeks  Status On-going    Target Date 02/08/22      PT LONG TERM GOAL #5   Title Improve functional limitation score to 64    Time 12    Period Weeks    Status On-going    Target Date 02/08/22                   Plan - 01/24/22 1605     Clinical Impression Statement Pt cleared to work on ROM of Rt knee greater than 60.  His Rt knee flexion PROM was 75 today.  Pt tolerated new ROM exercises  to work on increased knee flexion.  Pt encouraged to ice knee after ROM exercises at home.  Pt making good gains towards LTGs.    Rehab Potential Good    PT Frequency 2x / week    PT Duration 8 weeks    PT Treatment/Interventions ADLs/Self Care Home Management;Aquatic Therapy;Cryotherapy;Electrical Stimulation;Iontophoresis 67m/ml Dexamethasone;Moist Heat;Ultrasound;Gait training;Stair training;Functional mobility training;Therapeutic activities;Therapeutic exercise;Balance training;Neuromuscular re-education;Patient/family education;Manual techniques;Passive range of motion;Dry needling;Taping;Vasopneumatic Device    PT Next Visit Plan continue progressive Rt knee ROM and functional strength to assist in return to work.    PT Home Exercise Plan [email or web address]    Consulted and Agree with Plan of Care Patient             Patient will benefit from skilled therapeutic intervention in order to improve the following deficits and impairments:  Abnormal gait, Decreased range of motion, Decreased activity tolerance, Decreased balance, Impaired flexibility, Decreased mobility, Decreased strength, Increased edema, Postural dysfunction  Visit Diagnosis: Stiffness of right knee  Other symptoms and signs involving the musculoskeletal system  Muscle weakness (generalized)  Other abnormalities of gait and mobility     Problem List Patient Active Problem List   Diagnosis Date Noted   Positive colorectal cancer screening using Cologuard test 12/25/2021   Fracture of patella, right, closed 10/10/2021   Hypertriglyceridemia 05/18/2014   Prediabetes 05/18/2014   Annual physical exam 05/17/2014   LPRD (laryngopharyngeal reflux disease) 04/10/2013   Smoker 12/04/2012   Hypertension 12/04/2012   Left L5 lumbar radiculopathy 11/04/2012   JKerin Perna PTA 01/24/22 4:51 PM  [place] [address] [address]Phone:  [phone]  Fax:  [phone] Name: PYANNI QUIROAMRN: [id number] of Birth: 505/01/1971

## 2022-01-24 NOTE — Patient Instructions (Signed)
Access Code: [email or web address] URL: [email or web address] Date: 01/24/2022 Prepared by: [place]  Exercises  Supine Heel Slide with Strap - 1 x daily - 7 x weekly - 3 sets - 10 reps Supine Knee Flexion AAROM at Wall - 1 x daily - 7 x weekly - 3 sets - 10 reps Standing Knee Flexion AROM - 1 x daily - 7 x weekly - 3 sets - 10 reps Seated Knee Flexion Extension AAROM with Overpressure - 1 x daily - 7 x weekly - 3 sets - 10 reps Prone Quad Stretch with Towel Roll and Strap - 2 x daily - 7 x weekly - 3 reps - 30 seconds hold

## 2022-01-26 ENCOUNTER — Other Ambulatory Visit: Payer: Self-pay

## 2022-01-26 ENCOUNTER — Encounter: Payer: Self-pay | Admitting: Physical Therapy

## 2022-01-26 ENCOUNTER — Ambulatory Visit: Payer: Worker's Compensation | Admitting: Physical Therapy

## 2022-01-26 DIAGNOSIS — R2689 Other abnormalities of gait and mobility: Secondary | ICD-10-CM

## 2022-01-26 DIAGNOSIS — M25661 Stiffness of right knee, not elsewhere classified: Secondary | ICD-10-CM | POA: Diagnosis not present

## 2022-01-26 DIAGNOSIS — R29898 Other symptoms and signs involving the musculoskeletal system: Secondary | ICD-10-CM

## 2022-01-26 DIAGNOSIS — M6281 Muscle weakness (generalized): Secondary | ICD-10-CM

## 2022-01-26 NOTE — Therapy (Signed)
[place], [place], [address] Phone: [phone]   Fax:  [phone]  Physical Therapy Treatment  Patient Details  Name: Nicholas Pearson MRN: [id number] Date of Birth: 01-07-1971 Referring Provider (PT): Dr Marchia Bond   Encounter Date: 01/26/2022   PT End of Session - 01/26/22 1142     Visit Number 16    Number of Visits 20    Date for PT Re-Evaluation 02/08/22    PT Start Time 1103    PT Stop Time 1142    PT Time Calculation (min) 39 min    Activity Tolerance Patient tolerated treatment well    Behavior During Therapy [facility] for tasks assessed/performed             Past Medical History:  Diagnosis Date   Hypertension    Patellar tendon rupture    right    Past Surgical History:  Procedure Laterality Date   KNEE SURGERY     left   PATELLAR TENDON REPAIR Right 10/17/2021   Procedure: RIGHT PATELLA TENDON REPAIR;  Surgeon: Marchia Bond, MD;  Location: [place];  Service: Orthopedics;  Laterality: Right;   WISDOM TOOTH EXTRACTION      There were no vitals filed for this visit.   Subjective Assessment - 01/26/22 1105     Subjective Pt reports he used his [employer] bike for ROM yesterday and all the other exercises. He states he feel like his range increased past 75.  He iced 3x yesterday.    Patient Stated Goals get back walking to return to work    Currently in Pain? Yes    Pain Score 3     Pain Location Knee    Pain Orientation Right    Pain Descriptors / Indicators Sore    Aggravating Factors  prolonged sitting in car    Pain Relieving Factors ice                [facility] PT Assessment - 01/26/22 0001       Assessment   Medical Diagnosis Rt patella tendon repair    Referring Provider (PT) Dr Marchia Bond    Onset Date/Surgical Date 10/06/21   surgery 10/17/21   Hand Dominance Right    Next MD Visit 02/19/22    Prior Therapy during college basketball       PROM   Right Knee Flexion 82      Flexibility   Soft Tissue Assessment /Muscle Length yes    Quadriceps Rt: 23                [facility] Adult PT Treatment/Exercise - 01/26/22 0001       Ambulation/Gait   Ambulation/Gait Assistance 7: Independent    Ambulation Distance (Feet) 300 Feet    Assistive device None    Gait Pattern Step-through pattern;Decreased stance time - right;Decreased hip/knee flexion - right;Decreased dorsiflexion - right;Decreased weight shift to right;Wide base of support    Stairs Yes   Lt step down, Rt retro step up   Stair Management Technique One rail Left;Forwards;Backwards;Step to pattern    Number of Stairs 12    Height of Stairs 6    Gait Comments tactile cues for even arm swing, VC to decrease Lt step length and allow Rt knee to flex more during swing through.      Knee/Hip Exercises: Stretches   Passive Hamstring Stretch Right;3 reps;30 seconds   foot on chair   [employer]  Stretch 3 reps;30 seconds   prone with towel above knee   Gastroc Stretch Right;3 reps;20 seconds    Soleus Stretch Right;2 reps;20 seconds      Knee/Hip Exercises: Aerobic   Recumbent Bike partial revolutions for ROM x 5 min      Knee/Hip Exercises: Standing   Lateral Step Up Right;1 set;Hand Hold: 1;10 reps;Step Height: 8"    Other Standing Knee Exercises forward lunge with foot on chair x 3 reps of 30 sec    Other Standing Knee Exercises mini split squats x 10 each leg forward      Knee/Hip Exercises: Seated   Long Arc Quad Right;5 reps   after overpressure into flexion   Other Seated Knee/Hip Exercises seated with feet dangling off elevated table, AAROM intoknee flexion with overpressure from LLE x 15 sec holds x 5 reps    Sit to Sand 1 set;10 reps   staggered stance with RLE back, elevated table, UE assist                      PT Short Term Goals - 01/24/22 1630       PT SHORT TERM GOAL #1   Title Evaluation of ROM/strength Rt LE as indicated    Time  4    Period Weeks    Status On-going    Target Date 12/28/21      PT SHORT TERM GOAL #2   Title Increase AROM Rt knee to 0 deg extension; 90 or greater flexion    Time 4    Period Weeks    Status Partially Met    Target Date 12/28/21      PT SHORT TERM GOAL #3   Title Independent with gait with least restrictive assistive device - weight bearing as allowed by MD    Time 4    Period Weeks    Status Achieved    Target Date 12/28/21      PT SHORT TERM GOAL #4   Title Independent in initial HEP including aquatic exercises as indicated    Time 6    Period Weeks    Status Achieved    Target Date 01/11/22               PT Long Term Goals - 01/24/22 1629       PT LONG TERM GOAL #1   Title Increase AROM Rt knee to WFL's allowing patient to bend knee for all transfers and transitional movements necessary for return to work(RTW)    Time 12    Period Weeks    Status On-going    Target Date 02/08/22      PT LONG TERM GOAL #2   Title Rt LE strength 4+/5 to 5/5    Time 12    Period Weeks    Status Partially Met    Target Date 02/08/22      PT LONG TERM GOAL #3   Title Independent ambulation without assistive device with good gait pattern allowing patient to ambulate for community and work distances for RTW    Time 12    Period Weeks    Status Partially Met    Target Date 02/08/22      PT LONG TERM GOAL #4   Title Independent in HEP including aquatic program prior to discharge    Time 12    Period Weeks    Status On-going    Target Date 02/08/22      PT  LONG TERM GOAL #5   Title Improve functional limitation score to 64    Time 12    Period Weeks    Status On-going    Target Date 02/08/22                   Plan - 01/26/22 1122     Clinical Impression Statement Pt has gained another 10 of flexion in Rt knee PROM.  He reported decreased Rt knee pain with exercises.  Progressing well towards LTGs.    Rehab Potential Good    PT Frequency 2x / week     PT Duration 8 weeks    PT Treatment/Interventions ADLs/Self Care Home Management;Aquatic Therapy;Cryotherapy;Electrical Stimulation;Iontophoresis 59m/ml Dexamethasone;Moist Heat;Ultrasound;Gait training;Stair training;Functional mobility training;Therapeutic activities;Therapeutic exercise;Balance training;Neuromuscular re-education;Patient/family education;Manual techniques;Passive range of motion;Dry needling;Taping;Vasopneumatic Device    PT Next Visit Plan continue progressive Rt knee ROM and functional strength to assist in return to work.    PT Home Exercise Plan [email or web address]    Consulted and Agree with Plan of Care Patient             Patient will benefit from skilled therapeutic intervention in order to improve the following deficits and impairments:  Abnormal gait, Decreased range of motion, Decreased activity tolerance, Decreased balance, Impaired flexibility, Decreased mobility, Decreased strength, Increased edema, Postural dysfunction  Visit Diagnosis: Stiffness of right knee  Other symptoms and signs involving the musculoskeletal system  Muscle weakness (generalized)  Other abnormalities of gait and mobility     Problem List Patient Active Problem List   Diagnosis Date Noted   Positive colorectal cancer screening using Cologuard test 12/25/2021   Fracture of patella, right, closed 10/10/2021   Hypertriglyceridemia 05/18/2014   Prediabetes 05/18/2014   Annual physical exam 05/17/2014   LPRD (laryngopharyngeal reflux disease) 04/10/2013   Smoker 12/04/2012   Hypertension 12/04/2012   Left L5 lumbar radiculopathy 11/04/2012   JKerin Perna PTA 01/26/22 11:45 AM  [place] [address]Phone: [phone]  Fax:  [phone] Name: Nicholas MECKESMRN: [id number] of Birth: [birth year]

## 2022-01-31 ENCOUNTER — Ambulatory Visit: Payer: Worker's Compensation | Admitting: Physical Therapy

## 2022-01-31 ENCOUNTER — Other Ambulatory Visit: Payer: Self-pay

## 2022-01-31 DIAGNOSIS — M6281 Muscle weakness (generalized): Secondary | ICD-10-CM

## 2022-01-31 DIAGNOSIS — R2689 Other abnormalities of gait and mobility: Secondary | ICD-10-CM

## 2022-01-31 DIAGNOSIS — M25661 Stiffness of right knee, not elsewhere classified: Secondary | ICD-10-CM

## 2022-01-31 DIAGNOSIS — R29898 Other symptoms and signs involving the musculoskeletal system: Secondary | ICD-10-CM

## 2022-01-31 NOTE — Therapy (Signed)
[place], [place], [address] Phone: [phone]   Fax:  [phone]  Physical Therapy Treatment  Patient Details  Name: Nicholas Pearson MRN: [id number] Date of Birth: 1971/03/28 Referring Provider (PT): Dr Marchia Bond   Encounter Date: 01/31/2022   PT End of Session - 01/31/22 1445     Visit Number 17    Number of Visits 20    Date for PT Re-Evaluation 02/08/22    PT Start Time 6503    PT Stop Time 1430    PT Time Calculation (min) 45 min    Activity Tolerance Patient tolerated treatment well    Behavior During Therapy [facility] for tasks assessed/performed             Past Medical History:  Diagnosis Date   Hypertension    Patellar tendon rupture    right    Past Surgical History:  Procedure Laterality Date   KNEE SURGERY     left   PATELLAR TENDON REPAIR Right 10/17/2021   Procedure: RIGHT PATELLA TENDON REPAIR;  Surgeon: Marchia Bond, MD;  Location: [place];  Service: Orthopedics;  Laterality: Right;   WISDOM TOOTH EXTRACTION      There were no vitals filed for this visit.   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 He states he climbed in his work truck just to see how it felt and states that he thinks he could drive short distances but worries about endurance    Patient Stated Goals get back walking to return to work    Currently in Pain? Yes    Pain Score 3     Pain Location Knee    Pain Orientation Right    Pain Descriptors / Indicators Sore                [facility] PT Assessment - 01/31/22 0001       Assessment   Medical Diagnosis Rt patella tendon repair    Referring Provider (PT) Dr Marchia Bond    Onset Date/Surgical Date 10/06/21   surgery 10/17/21   Hand Dominance Right    Next MD Visit 02/19/22    Prior Therapy during college basketball      AROM   Right Knee Flexion  85      Flexibility   Quadriceps Rt 80                           [facility] Adult PT Treatment/Exercise - 01/31/22 0001       Ambulation/Gait   Gait Comments cues for Rt knee flexion during swing through      Knee/Hip Exercises: Stretches   Passive Hamstring Stretch Right;3 reps;30 seconds   foot on chair   Quad Stretch 3 reps;30 seconds   prone with towel above knee   Gastroc Stretch Right;3 reps;20 seconds    Soleus Stretch Right;2 reps;20 seconds      Knee/Hip Exercises: Aerobic   Recumbent Bike partial revolutions for ROM x 5 min      Knee/Hip Exercises: Machines for Strengthening   Cybex Leg Press Rt LE only 6 plates x 20 with focus on eccentric flexion      Knee/Hip Exercises: Standing   Forward Step Up Right;2 sets;Hand Hold: 0;10 reps    Forward Step Up Limitations 10'' step    Functional Squat  2 sets;10 reps    Functional Squat Limitations to elevated table    Other Standing Knee Exercises forward lunge with foot on chair x 3 reps of 30 sec    Other Standing Knee Exercises mini split squats x 10 each leg forward      Manual Therapy   Manual therapy comments contract/relax prone quad stretch                       PT Short Term Goals - 01/24/22 1630       PT SHORT TERM GOAL #1   Title Evaluation of ROM/strength Rt LE as indicated    Time 4    Period Weeks    Status On-going    Target Date 12/28/21      PT SHORT TERM GOAL #2   Title Increase AROM Rt knee to 0 deg extension; 90 or greater flexion    Time 4    Period Weeks    Status Partially Met    Target Date 12/28/21      PT SHORT TERM GOAL #3   Title Independent with gait with least restrictive assistive device - weight bearing as allowed by MD    Time 4    Period Weeks    Status Achieved    Target Date 12/28/21      PT SHORT TERM GOAL #4   Title Independent in initial HEP including aquatic exercises as indicated    Time 6    Period Weeks    Status Achieved    Target  Date 01/11/22               PT Long Term Goals - 01/24/22 1629       PT LONG TERM GOAL #1   Title Increase AROM Rt knee to WFL's allowing patient to bend knee for all transfers and transitional movements necessary for return to work(RTW)    Time 12    Period Weeks    Status On-going    Target Date 02/08/22      PT LONG TERM GOAL #2   Title Rt LE strength 4+/5 to 5/5    Time 12    Period Weeks    Status Partially Met    Target Date 02/08/22      PT LONG TERM GOAL #3   Title Independent ambulation without assistive device with good gait pattern allowing patient to ambulate for community and work distances for RTW    Time 12    Period Weeks    Status Partially Met    Target Date 02/08/22      PT LONG TERM GOAL #4   Title Independent in HEP including aquatic program prior to discharge    Time 12    Period Weeks    Status On-going    Target Date 02/08/22      PT LONG TERM GOAL #5   Title Improve functional limitation score to 64    Time 12    Period Weeks    Status On-going    Target Date 02/08/22                   Plan - 01/31/22 1445     Clinical Impression Statement Pt able to start session with 80 degrees of Rt knee flexion. Pt with good ROM improvements with contract/relax stretching    PT Next Visit Plan continue progressive Rt knee ROM and functional strength to assist in return to work.  PT Home Exercise Plan [email or web address]    Consulted and Agree with Plan of Care Patient             Patient will benefit from skilled therapeutic intervention in order to improve the following deficits and impairments:     Visit Diagnosis: Stiffness of right knee  Other symptoms and signs involving the musculoskeletal system  Muscle weakness (generalized)  Other abnormalities of gait and mobility     Problem List Patient Active Problem List   Diagnosis Date Noted   Positive colorectal cancer screening using Cologuard test 12/25/2021   Fracture of  patella, right, closed 10/10/2021   Hypertriglyceridemia 05/18/2014   Prediabetes 05/18/2014   Annual physical exam 05/17/2014   LPRD (laryngopharyngeal reflux disease) 04/10/2013   Smoker 12/04/2012   Hypertension 12/04/2012   Left L5 lumbar radiculopathy 11/04/2012    Lashell Moffitt, PT 01/31/2022, 2:47 PM  [facility] [place], [place], [address] Phone: [phone]   Fax:  [phone]  Name: Nicholas Pearson MRN: [id number] Date of Birth: 19-Sep-1971

## 2022-02-02 ENCOUNTER — Other Ambulatory Visit: Payer: Self-pay

## 2022-02-02 ENCOUNTER — Ambulatory Visit: Payer: Worker's Compensation | Admitting: Physical Therapy

## 2022-02-02 DIAGNOSIS — M25661 Stiffness of right knee, not elsewhere classified: Secondary | ICD-10-CM

## 2022-02-02 DIAGNOSIS — M6281 Muscle weakness (generalized): Secondary | ICD-10-CM

## 2022-02-02 DIAGNOSIS — R29898 Other symptoms and signs involving the musculoskeletal system: Secondary | ICD-10-CM

## 2022-02-02 DIAGNOSIS — R2689 Other abnormalities of gait and mobility: Secondary | ICD-10-CM

## 2022-02-02 NOTE — Therapy (Signed)
[place], [place], [address] Phone: [phone]   Fax:  [phone]  Physical Therapy Treatment  Patient Details  Name: Nicholas Pearson MRN: [id number] Date of Birth: 13-Apr-1971 Referring Provider (PT): Dr Marchia Bond   Encounter Date: 02/02/2022   PT End of Session - 02/02/22 1230     Visit Number 18    Number of Visits 20    Date for PT Re-Evaluation 02/08/22    PT Start Time 8938    PT Stop Time 1228    PT Time Calculation (min) 43 min    Activity Tolerance Patient tolerated treatment well    Behavior During Therapy [facility] for tasks assessed/performed             Past Medical History:  Diagnosis Date   Hypertension    Patellar tendon rupture    right    Past Surgical History:  Procedure Laterality Date   KNEE SURGERY     left   PATELLAR TENDON REPAIR Right 10/17/2021   Procedure: RIGHT PATELLA TENDON REPAIR;  Surgeon: Marchia Bond, MD;  Location: [place];  Service: Orthopedics;  Laterality: Right;   WISDOM TOOTH EXTRACTION      There were no vitals filed for this visit.   Subjective Assessment - 02/02/22 1148     Subjective Pt states he feels achey today due to the rain. No knee pain. He has continued to work on his peloton for ROM    Patient Stated Goals get back walking to return to work    Currently in Pain? No/denies                               [facility] Adult PT Treatment/Exercise - 02/02/22 0001       Knee/Hip Exercises: Stretches   Passive Hamstring Stretch Right;3 reps;30 seconds   foot on chair   Quad Stretch 3 reps;30 seconds   prone with towel above knee   Gastroc Stretch Right;3 reps;20 seconds    Soleus Stretch Right;2 reps;20 seconds      Knee/Hip Exercises: Aerobic   Recumbent Bike partial revolutions for ROM x 5 min      Knee/Hip Exercises: Machines for Strengthening   Cybex Leg Press Rt LE only 6 plates x 20  with focus on eccentric flexion      Knee/Hip Exercises: Standing   Lateral Step Up Right;Left;10 reps;Hand Hold: 0;2 sets   10'' step   Lateral Step Up Limitations straddle step    Forward Step Up Right;2 sets;Hand Hold: 0;10 reps    Forward Step Up Limitations 10'' step    Functional Squat 2 sets;10 reps    Functional Squat Limitations to elevated table    Other Standing Knee Exercises forward lunge with foot on chair x 3 reps of 30 sec      Manual Therapy   Manual therapy comments contract/relax prone quad stretch                       PT Short Term Goals - 01/24/22 1630       PT SHORT TERM GOAL #1   Title Evaluation of ROM/strength Rt LE as indicated    Time 4    Period Weeks    Status On-going    Target Date 12/28/21      PT SHORT TERM GOAL #2   Title Increase AROM  Rt knee to 0 deg extension; 90 or greater flexion    Time 4    Period Weeks    Status Partially Met    Target Date 12/28/21      PT SHORT TERM GOAL #3   Title Independent with gait with least restrictive assistive device - weight bearing as allowed by MD    Time 4    Period Weeks    Status Achieved    Target Date 12/28/21      PT SHORT TERM GOAL #4   Title Independent in initial HEP including aquatic exercises as indicated    Time 6    Period Weeks    Status Achieved    Target Date 01/11/22               PT Long Term Goals - 01/24/22 1629       PT LONG TERM GOAL #1   Title Increase AROM Rt knee to WFL's allowing patient to bend knee for all transfers and transitional movements necessary for return to work(RTW)    Time 12    Period Weeks    Status On-going    Target Date 02/08/22      PT LONG TERM GOAL #2   Title Rt LE strength 4+/5 to 5/5    Time 12    Period Weeks    Status Partially Met    Target Date 02/08/22      PT LONG TERM GOAL #3   Title Independent ambulation without assistive device with good gait pattern allowing patient to ambulate for community and work  distances for RTW    Time 12    Period Weeks    Status Partially Met    Target Date 02/08/22      PT LONG TERM GOAL #4   Title Independent in HEP including aquatic program prior to discharge    Time 12    Period Weeks    Status On-going    Target Date 02/08/22      PT LONG TERM GOAL #5   Title Improve functional limitation score to 64    Time 12    Period Weeks    Status On-going    Target Date 02/08/22                   Plan - 02/02/22 1230     Clinical Impression Statement Pt able to start session at 85 degrees of knee flexion. Pt continues with good progress in strength and ROM    PT Next Visit Plan continue progressive Rt knee ROM and functional strength to assist in return to work.    PT Home Exercise Plan [id number]    Consulted and Agree with Plan of Care Patient             Patient will benefit from skilled therapeutic intervention in order to improve the following deficits and impairments:     Visit Diagnosis: Stiffness of right knee  Other symptoms and signs involving the musculoskeletal system  Muscle weakness (generalized)  Other abnormalities of gait and mobility     Problem List Patient Active Problem List   Diagnosis Date Noted   Positive colorectal cancer screening using Cologuard test 12/25/2021   Fracture of patella, right, closed 10/10/2021   Hypertriglyceridemia 05/18/2014   Prediabetes 05/18/2014   Annual physical exam 05/17/2014   LPRD (laryngopharyngeal reflux disease) 04/10/2013   Smoker 12/04/2012   Hypertension 12/04/2012   Left L5 lumbar radiculopathy 11/04/2012  Alazay Leicht, PT 02/02/2022, 12:32 PM  [facility] [place], [place], [address] Phone: [phone]   Fax:  [phone]  Name: KALA GASSMANN MRN: [id number] Date of Birth: [birth year]

## 2022-02-07 ENCOUNTER — Ambulatory Visit: Payer: Worker's Compensation | Admitting: Physical Therapy

## 2022-02-07 ENCOUNTER — Other Ambulatory Visit: Payer: Self-pay

## 2022-02-07 DIAGNOSIS — M25661 Stiffness of right knee, not elsewhere classified: Secondary | ICD-10-CM

## 2022-02-07 DIAGNOSIS — M6281 Muscle weakness (generalized): Secondary | ICD-10-CM

## 2022-02-07 DIAGNOSIS — R29898 Other symptoms and signs involving the musculoskeletal system: Secondary | ICD-10-CM

## 2022-02-07 DIAGNOSIS — R2689 Other abnormalities of gait and mobility: Secondary | ICD-10-CM

## 2022-02-07 NOTE — Therapy (Signed)
[place], [place], [address] Phone: [phone]   Fax:  [phone]  Physical Therapy Treatment  Patient Details  Name: Nicholas Pearson MRN: [id number] Date of Birth: December 20, 1970 Referring Provider (PT): Dr Marchia Bond   Encounter Date: 02/07/2022   PT End of Session - 02/07/22 1428     Visit Number 19    Number of Visits 20    Date for PT Re-Evaluation 02/08/22    PT Start Time 5364    PT Stop Time 1426    PT Time Calculation (min) 41 min    Activity Tolerance Patient tolerated treatment well    Behavior During Therapy [facility] for tasks assessed/performed             Past Medical History:  Diagnosis Date   Hypertension    Patellar tendon rupture    right    Past Surgical History:  Procedure Laterality Date   KNEE SURGERY     left   PATELLAR TENDON REPAIR Right 10/17/2021   Procedure: RIGHT PATELLA TENDON REPAIR;  Surgeon: Marchia Bond, MD;  Location: [place];  Service: Orthopedics;  Laterality: Right;   WISDOM TOOTH EXTRACTION      There were no vitals filed for this visit.   Subjective Assessment - 02/07/22 1348     Subjective Pt states he has been working hard on flexion ROM at home. He feels "I think I can bend it farther"    Patient Stated Goals get back walking to return to work    Currently in Pain? No/denies                [facility] PT Assessment - 02/07/22 0001       Assessment   Medical Diagnosis Rt patella tendon repair    Referring Provider (PT) Dr Marchia Bond    Onset Date/Surgical Date 10/06/21   surgery 10/17/21   Hand Dominance Right    Next MD Visit 02/19/22    Prior Therapy during college basketball      AROM   Right Knee Flexion 90                           [facility] Adult PT Treatment/Exercise - 02/07/22 0001       Ambulation/Gait   Stair Management Technique Alternating pattern;One rail Left    Number of  Stairs 36    Height of Stairs 6      Knee/Hip Exercises: Stretches   Passive Hamstring Stretch Right;3 reps;30 seconds   foot on chair   Quad Stretch 3 reps;30 seconds   prone with towel above knee     Knee/Hip Exercises: Aerobic   Recumbent Bike partial revolutions for ROM x 5 min      Knee/Hip Exercises: Machines for Strengthening   Cybex Leg Press Rt LE only 6 plates x 20 with focus on eccentric flexion   seat 9     Knee/Hip Exercises: Standing   Wall Squat 10 reps;5 seconds    Wall Squat Limitations tolerable range    Other Standing Knee Exercises forward lunge with foot on chair x 3 reps of 30 sec      Knee/Hip Exercises: Seated   Stool Scoot - Round Trips 2 with focus on knee flexion ROM      Manual Therapy   Manual therapy comments contract/relax prone quad stretch  PT Short Term Goals - 01/24/22 1630       PT SHORT TERM GOAL #1   Title Evaluation of ROM/strength Rt LE as indicated    Time 4    Period Weeks    Status On-going    Target Date 12/28/21      PT SHORT TERM GOAL #2   Title Increase AROM Rt knee to 0 deg extension; 90 or greater flexion    Time 4    Period Weeks    Status Partially Met    Target Date 12/28/21      PT SHORT TERM GOAL #3   Title Independent with gait with least restrictive assistive device - weight bearing as allowed by MD    Time 4    Period Weeks    Status Achieved    Target Date 12/28/21      PT SHORT TERM GOAL #4   Title Independent in initial HEP including aquatic exercises as indicated    Time 6    Period Weeks    Status Achieved    Target Date 01/11/22               PT Long Term Goals - 01/24/22 1629       PT LONG TERM GOAL #1   Title Increase AROM Rt knee to WFL's allowing patient to bend knee for all transfers and transitional movements necessary for return to work(RTW)    Time 12    Period Weeks    Status On-going    Target Date 02/08/22      PT LONG TERM GOAL #2    Title Rt LE strength 4+/5 to 5/5    Time 12    Period Weeks    Status Partially Met    Target Date 02/08/22      PT LONG TERM GOAL #3   Title Independent ambulation without assistive device with good gait pattern allowing patient to ambulate for community and work distances for RTW    Time 12    Period Weeks    Status Partially Met    Target Date 02/08/22      PT LONG TERM GOAL #4   Title Independent in HEP including aquatic program prior to discharge    Time 12    Period Weeks    Status On-going    Target Date 02/08/22      PT LONG TERM GOAL #5   Title Improve functional limitation score to 64    Time 12    Period Weeks    Status On-going    Target Date 02/08/22                   Plan - 02/07/22 1429     Clinical Impression Statement Pt improved to 90 degrees of knee flexion. Progressing well with strength and stability. Able to add wall squats with cues for equal wt bearing    PT Next Visit Plan RECERT, Rt knee ROM    PT Home Exercise Plan [id number]             Patient will benefit from skilled therapeutic intervention in order to improve the following deficits and impairments:     Visit Diagnosis: Stiffness of right knee  Other symptoms and signs involving the musculoskeletal system  Muscle weakness (generalized)  Other abnormalities of gait and mobility     Problem List Patient Active Problem List   Diagnosis Date Noted   Positive colorectal cancer screening using Cologuard  test 12/25/2021   Fracture of patella, right, closed 10/10/2021   Hypertriglyceridemia 05/18/2014   Prediabetes 05/18/2014   Annual physical exam 05/17/2014   LPRD (laryngopharyngeal reflux disease) 04/10/2013   Smoker 12/04/2012   Hypertension 12/04/2012   Left L5 lumbar radiculopathy 11/04/2012    [name], PT 02/07/2022, 2:32 PM  [facility] [place], [place], [address] Phone:  [phone]   Fax:  [phone]  Name: Nicholas Pearson MRN: [id number] Date of Birth: April 13, 1971

## 2022-02-09 ENCOUNTER — Other Ambulatory Visit: Payer: Self-pay

## 2022-02-09 ENCOUNTER — Ambulatory Visit: Payer: Worker's Compensation | Admitting: Physical Therapy

## 2022-02-09 DIAGNOSIS — M25661 Stiffness of right knee, not elsewhere classified: Secondary | ICD-10-CM

## 2022-02-09 DIAGNOSIS — R29898 Other symptoms and signs involving the musculoskeletal system: Secondary | ICD-10-CM

## 2022-02-09 DIAGNOSIS — M6281 Muscle weakness (generalized): Secondary | ICD-10-CM

## 2022-02-09 DIAGNOSIS — R2689 Other abnormalities of gait and mobility: Secondary | ICD-10-CM

## 2022-02-09 NOTE — Therapy (Signed)
[place], [place], [address] Phone: [phone]   Fax:  [phone]  Physical Therapy Treatment and Recertification  Patient Details  Name: Nicholas Pearson MRN: [id number] Date of Birth: 1971-07-28 Referring Provider (PT): Dr Marchia Bond   Encounter Date: 02/09/2022   PT End of Session - 02/09/22 1228     Visit Number 20    Number of Visits 28    Date for PT Re-Evaluation 03/09/22    PT Start Time 2671    PT Stop Time 1228    PT Time Calculation (min) 43 min    Activity Tolerance Patient tolerated treatment well    Behavior During Therapy [facility] for tasks assessed/performed             Past Medical History:  Diagnosis Date   Hypertension    Patellar tendon rupture    right    Past Surgical History:  Procedure Laterality Date   KNEE SURGERY     left   PATELLAR TENDON REPAIR Right 10/17/2021   Procedure: RIGHT PATELLA TENDON REPAIR;  Surgeon: Marchia Bond, MD;  Location: [place];  Service: Orthopedics;  Laterality: Right;   WISDOM TOOTH EXTRACTION      There were no vitals filed for this visit.   Subjective Assessment - 02/09/22 1150     Subjective Pt states "I've felt better, I've felt worse" He has tried some of the advanced HEP    Patient Stated Goals get back walking to return to work    Currently in Pain? No/denies                [facility] PT Assessment - 02/09/22 0001       Assessment   Medical Diagnosis Rt patella tendon repair    Referring Provider (PT) Dr Marchia Bond    Onset Date/Surgical Date 10/06/21   surgery 10/17/21   Hand Dominance Right    Next MD Visit 02/19/22    Prior Therapy during college basketball      Observation/Other Assessments   Focus on Therapeutic Outcomes (FOTO)  37      AROM   Right Knee Extension 0    Right Knee Flexion 93      Strength   Overall Strength Comments Rt knee flex 4/5, Rt knee ext 4/5       Flexibility   Quadriceps 87                           [facility] Adult PT Treatment/Exercise - 02/09/22 0001       Knee/Hip Exercises: Stretches   Passive Hamstring Stretch Right;3 reps;30 seconds   foot on chair   Quad Stretch 3 reps;30 seconds   prone with towel above knee     Knee/Hip Exercises: Aerobic   Recumbent Bike partial revolutions for ROM x 5 min      Knee/Hip Exercises: Machines for Strengthening   Cybex Leg Press Rt LE only 7 plates x 20   seat 9     Knee/Hip Exercises: Standing   Forward Step Up Right;2 sets;10 reps;Hand Hold: 0    Forward Step Up Limitations 12''    Wall Squat 20 reps;5 seconds    Wall Squat Limitations tolerable range    Other Standing Knee Exercises forward lunge with foot on chair x 3 reps of 30 sec      Manual Therapy   Manual therapy comments contract/relax prone  quad stretch                       PT Short Term Goals - 01/24/22 1630       PT SHORT TERM GOAL #1   Title Evaluation of ROM/strength Rt LE as indicated    Time 4    Period Weeks    Status On-going    Target Date 12/28/21      PT SHORT TERM GOAL #2   Title Increase AROM Rt knee to 0 deg extension; 90 or greater flexion    Time 4    Period Weeks    Status Partially Met    Target Date 12/28/21      PT SHORT TERM GOAL #3   Title Independent with gait with least restrictive assistive device - weight bearing as allowed by MD    Time 4    Period Weeks    Status Achieved    Target Date 12/28/21      PT SHORT TERM GOAL #4   Title Independent in initial HEP including aquatic exercises as indicated    Time 6    Period Weeks    Status Achieved    Target Date 01/11/22               PT Long Term Goals - 02/09/22 1152       PT LONG TERM GOAL #1   Title Increase AROM Rt knee to WFL's allowing patient to bend knee for all transfers and transitional movements necessary for return to work(RTW)    Status On-going    Target Date 03/09/22       PT LONG TERM GOAL #2   Title Rt LE strength 4+/5 to 5/5    Status On-going    Target Date 03/09/22      PT LONG TERM GOAL #3   Title Independent ambulation without assistive device with good gait pattern allowing patient to ambulate for community and work distances for RTW    Status Achieved      PT LONG TERM GOAL #4   Title Independent in HEP including aquatic program prior to discharge    Status On-going      PT LONG TERM GOAL #5   Title Improve functional limitation score to 64    Baseline 65    Status Achieved                   Plan - 02/09/22 1228     Clinical Impression Statement Pt is improving ROM and strenght, progressing towards long term goals. Pt will continue to benefit from skilled PT for strength and endurance training    PT Next Visit Plan Rt knee strength and ROM    PT Home Exercise Plan [id number]    Consulted and Agree with Plan of Care Patient             Patient will benefit from skilled therapeutic intervention in order to improve the following deficits and impairments:     Visit Diagnosis: Stiffness of right knee - Plan: PT plan of care cert/re-cert  Muscle weakness (generalized) - Plan: PT plan of care cert/re-cert  Other abnormalities of gait and mobility - Plan: PT plan of care cert/re-cert  Other symptoms and signs involving the musculoskeletal system - Plan: PT plan of care cert/re-cert     Problem List Patient Active Problem List   Diagnosis Date Noted   Positive colorectal cancer screening using Cologuard test 12/25/2021  Fracture of patella, right, closed 10/10/2021   Hypertriglyceridemia 05/18/2014   Prediabetes 05/18/2014   Annual physical exam 05/17/2014   LPRD (laryngopharyngeal reflux disease) 04/10/2013   Smoker 12/04/2012   Hypertension 12/04/2012   Left L5 lumbar radiculopathy 11/04/2012    Tylin Force, PT 02/09/2022, 12:30 PM  [place], [place], [address] Phone: [phone]   Fax:  [phone]  Name: Nicholas Pearson MRN: [id number] Date of Birth: May 01, 1971

## 2022-02-14 ENCOUNTER — Encounter: Payer: 59 | Admitting: Physical Therapy

## 2022-02-14 ENCOUNTER — Ambulatory Visit: Payer: Worker's Compensation | Attending: Orthopedic Surgery | Admitting: Physical Therapy

## 2022-02-14 ENCOUNTER — Other Ambulatory Visit: Payer: Self-pay

## 2022-02-14 DIAGNOSIS — M6281 Muscle weakness (generalized): Secondary | ICD-10-CM | POA: Insufficient documentation

## 2022-02-14 DIAGNOSIS — M25661 Stiffness of right knee, not elsewhere classified: Secondary | ICD-10-CM | POA: Diagnosis present

## 2022-02-14 DIAGNOSIS — R29898 Other symptoms and signs involving the musculoskeletal system: Secondary | ICD-10-CM | POA: Insufficient documentation

## 2022-02-14 DIAGNOSIS — R2689 Other abnormalities of gait and mobility: Secondary | ICD-10-CM | POA: Diagnosis present

## 2022-02-14 NOTE — Therapy (Signed)
[place] [facility] ?[place] ?[place], [place], [address] ?Phone: [phone]   Fax:  [phone] ? ?Physical Therapy Treatment ? ?Patient Details  ?Name: Nicholas Pearson ?MRN: [id number] ?Date of Birth: 04-25-1971 ?Referring Provider (PT): Dr Marchia Bond ? ? ?Encounter Date: 02/14/2022 ? ? PT End of Session - 02/14/22 1425   ? ? Visit Number 21   ? Number of Visits 28   ? Date for PT Re-Evaluation 03/09/22   ? PT Start Time 1345   ? PT Stop Time 1428   ? PT Time Calculation (min) 43 min   ? Activity Tolerance Patient tolerated treatment well   ? Behavior During Therapy [facility] for tasks assessed/performed   ? ?  ?  ? ?  ? ? ?Past Medical History:  ?Diagnosis Date  ? Hypertension   ? Patellar tendon rupture   ? right  ? ? ?Past Surgical History:  ?Procedure Laterality Date  ? KNEE SURGERY    ? left  ? PATELLAR TENDON REPAIR Right 10/17/2021  ? Procedure: RIGHT PATELLA TENDON REPAIR;  Surgeon: Marchia Bond, MD;  Location: [place];  Service: Orthopedics;  Laterality: Right;  ? WISDOM TOOTH EXTRACTION    ? ? ?There were no vitals filed for this visit. ? ? Subjective Assessment - 02/14/22 1350   ? ? Subjective Pt states "I feel stiff all over"   ? Patient Stated Goals get back walking to return to work   ? Currently in Pain? No/denies   ? ?  ?  ? ?  ? ? ? ? ? [facility] PT Assessment - 02/14/22 0001   ? ?  ? Assessment  ? Medical Diagnosis Rt patella tendon repair   ? Referring Provider (PT) Dr Marchia Bond   ? Onset Date/Surgical Date 10/06/21   surgery 10/17/21  ? Hand Dominance Right   ? Next MD Visit 02/19/22   ? Prior Therapy during college basketball   ?  ? AROM  ? Right Knee Extension 0   ? Right Knee Flexion 96   ?  ? Flexibility  ? Quadriceps 87   ? ?  ?  ? ?  ? ? ? ? ? ? ? ? ? ? ? ? ? ? ? ? [facility] Adult PT Treatment/Exercise - 02/14/22 0001   ? ?  ? Knee/Hip Exercises: Stretches  ? Passive Hamstring Stretch Right;3 reps;30 seconds   foot on chair  ?  Quad Stretch 3 reps;30 seconds   prone with towel above knee  ?  ? Knee/Hip Exercises: Aerobic  ? Recumbent Bike partial revolutions for ROM x 5 min   ?  ? Knee/Hip Exercises: Machines for Strengthening  ? Cybex Leg Press Rt LE only 7 plates x 30   seat 9  ?  ? Knee/Hip Exercises: Standing  ? Lateral Step Up 20 reps   ? Lateral Step Up Limitations straddle step 12''   ? Forward Step Up Right;2 sets;10 reps;Hand Hold: 0   holding yellow med ball  ? Forward Step Up Limitations 12''   ? Wall Squat 20 reps;5 seconds   ? Wall Squat Limitations tolerable range   ? Other Standing Knee Exercises forward lunge with foot on chair x 3 reps of 30 sec   ?  ? Manual Therapy  ? Manual therapy comments pt educated on and performed self ITB massage with rolling pin   ? ?  ?  ? ?  ? ? ? ? ? ? ? ? ? ? ? ?  PT Short Term Goals - 01/24/22 1630   ? ?  ? PT SHORT TERM GOAL #1  ? Title Evaluation of ROM/strength Rt LE as indicated   ? Time 4   ? Period Weeks   ? Status On-going   ? Target Date 12/28/21   ?  ? PT SHORT TERM GOAL #2  ? Title Increase AROM Rt knee to 0 deg extension; 90 or greater flexion   ? Time 4   ? Period Weeks   ? Status Partially Met   ? Target Date 12/28/21   ?  ? PT SHORT TERM GOAL #3  ? Title Independent with gait with least restrictive assistive device - weight bearing as allowed by MD   ? Time 4   ? Period Weeks   ? Status Achieved   ? Target Date 12/28/21   ?  ? PT SHORT TERM GOAL #4  ? Title Independent in initial HEP including aquatic exercises as indicated   ? Time 6   ? Period Weeks   ? Status Achieved   ? Target Date 01/11/22   ? ?  ?  ? ?  ? ? ? ? PT Long Term Goals - 02/09/22 1152   ? ?  ? PT LONG TERM GOAL #1  ? Title Increase AROM Rt knee to WFL's allowing patient to bend knee for all transfers and transitional movements necessary for return to work(RTW)   ? Status On-going   ? Target Date 03/09/22   ?  ? PT LONG TERM GOAL #2  ? Title Rt LE strength 4+/5 to 5/5   ? Status On-going   ? Target Date 03/09/22    ?  ? PT LONG TERM GOAL #3  ? Title Independent ambulation without assistive device with good gait pattern allowing patient to ambulate for community and work distances for RTW   ? Status Achieved   ?  ? PT LONG TERM GOAL #4  ? Title Independent in HEP including aquatic program prior to discharge   ? Status On-going   ?  ? PT LONG TERM GOAL #5  ? Title Improve functional limitation score to 64   ? Baseline 65   ? Status Achieved   ? ?  ?  ? ?  ? ? ? ? ? ? ? ? Plan - 02/14/22 1425   ? ? Clinical Impression Statement Pt continues to improve ROM and strength. Feels ready to d/c to HEP after next visit   ? PT Next Visit Plan update HEP, d/c   ? PT Home Exercise Plan [email or web address]   ? Consulted and Agree with Plan of Care Patient   ? ?  ?  ? ?  ? ? ?Patient will benefit from skilled therapeutic intervention in order to improve the following deficits and impairments:    ? ?Visit Diagnosis: ?Stiffness of right knee ? ?Muscle weakness (generalized) ? ?Other abnormalities of gait and mobility ? ?Other symptoms and signs involving the musculoskeletal system ? ? ? ? ?Problem List ?Patient Active Problem List  ? Diagnosis Date Noted  ? Positive colorectal cancer screening using Cologuard test 12/25/2021  ? Fracture of patella, right, closed 10/10/2021  ? Hypertriglyceridemia 05/18/2014  ? Prediabetes 05/18/2014  ? Annual physical exam 05/17/2014  ? LPRD (laryngopharyngeal reflux disease) 04/10/2013  ? Smoker 12/04/2012  ? Hypertension 12/04/2012  ? Left L5 lumbar radiculopathy 11/04/2012  ? ? ?Nashay Brickley, PT ?02/14/2022, 2:32 PM ? ?[place] [facility]  [address] ?[place], [place], [address] ?Phone: [phone]   Fax:  [phone] ? ?Name: Nicholas Pearson ?MRN: [id number] ?Date of Birth: Apr 06, 1971 ? ? ? ?

## 2022-02-16 ENCOUNTER — Other Ambulatory Visit: Payer: Self-pay

## 2022-02-16 ENCOUNTER — Ambulatory Visit: Payer: Worker's Compensation | Admitting: Physical Therapy

## 2022-02-16 DIAGNOSIS — R2689 Other abnormalities of gait and mobility: Secondary | ICD-10-CM

## 2022-02-16 DIAGNOSIS — R29898 Other symptoms and signs involving the musculoskeletal system: Secondary | ICD-10-CM

## 2022-02-16 DIAGNOSIS — M25661 Stiffness of right knee, not elsewhere classified: Secondary | ICD-10-CM

## 2022-02-16 DIAGNOSIS — M6281 Muscle weakness (generalized): Secondary | ICD-10-CM

## 2022-02-16 NOTE — Therapy (Signed)
[place] ?[facility] ?[place] ?[place], [place], [address] ?Phone: [phone]   Fax:  [phone] ? ?Physical Therapy Treatment and Discharge ? ?Patient Details  ?Name: Nicholas Pearson ?MRN: [id number] ?Date of Birth: 06-Jan-1971 ?Referring Provider (PT): Dr Marchia Bond ? ? ?Encounter Date: 02/16/2022 ? ? PT End of Session - 02/16/22 1225   ? ? Visit Number 22   ? Number of Visits 28   ? Date for PT Re-Evaluation 03/09/22   ? PT Start Time 1142   ? PT Stop Time 1226   ? PT Time Calculation (min) 44 min   ? Activity Tolerance Patient tolerated treatment well   ? Behavior During Therapy [facility] for tasks assessed/performed   ? ?  ?  ? ?  ? ? ?Past Medical History:  ?Diagnosis Date  ? Hypertension   ? Patellar tendon rupture   ? right  ? ? ?Past Surgical History:  ?Procedure Laterality Date  ? KNEE SURGERY    ? left  ? PATELLAR TENDON REPAIR Right 10/17/2021  ? Procedure: RIGHT PATELLA TENDON REPAIR;  Surgeon: Marchia Bond, MD;  Location: [facility];  Service: Orthopedics;  Laterality: Right;  ? WISDOM TOOTH EXTRACTION    ? ? ?There were no vitals filed for this visit. ? ? Subjective Assessment - 02/16/22 1149   ? ? 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 He states he feels comfortable with HEP   ? Patient Stated Goals get back walking to return to work   ? Currently in Pain? No/denies   ? ?  ?  ? ?  ? ? ? ? ? [facility] PT Assessment - 02/16/22 0001   ? ?  ? Assessment  ? Medical Diagnosis Rt patella tendon repair   ? Referring Provider (PT) Dr Marchia Bond   ? Onset Date/Surgical Date 10/06/21   surgery 10/17/21  ? Hand Dominance Right   ? Next MD Visit 02/19/22   ? Prior Therapy during college basketball   ?  ? AROM  ? Right Knee Extension 0   ? Right Knee Flexion 100   ?  ? Strength  ? Overall Strength Comments Rt knee grossly 4/5   ?  ? Flexibility  ? Quadriceps 88   ? ?  ?  ? ?  ? ? ? ? ? ? ? ? ? ? ? ? ? ? ? ? [facility]  Treatment/Exercise - 02/16/22 0001   ? ?  ? Knee/Hip Exercises: Stretches  ? Passive Hamstring Stretch Right;3 reps;30 seconds   foot on chair  ? Quad Stretch 3 reps;30 seconds   prone with towel above knee  ? Other Knee/Hip Stretches seated forward scoot for knee flexion ROM   ?  ? Knee/Hip Exercises: Aerobic  ? Recumbent Bike partial revolutions for ROM x 5 min   ?  ? Knee/Hip Exercises: Machines for Strengthening  ? Cybex Leg Press Rt LE only 8 plates 3 x 10   seat 9  ?  ? Knee/Hip Exercises: Standing  ? Lateral Step Up 20 reps   ? Lateral Step Up Limitations straddle step 12''   ? Forward Step Up Right;2 sets;10 reps;Hand Hold: 0   holding yellow med ball  ? Forward Step Up Limitations 12''   ? Wall Squat 20 reps;5 seconds   ? Wall Squat Limitations tolerable range   ? Other Standing Knee Exercises forward lunge with foot on chair x  3 reps of 30 sec   ? ?  ?  ? ?  ? ? ? ? ? ? ? ? ? ? PT Education - 02/16/22 1225   ? ? Education Details education on continued stretching and strengthening ideas when on the road   ? Person(s) Educated Patient   ? Methods Explanation   ? Comprehension Verbalized understanding   ? ?  ?  ? ?  ? ? ? PT Short Term Goals - 01/24/22 1630   ? ?  ? PT SHORT TERM GOAL #1  ? Title Evaluation of ROM/strength Rt LE as indicated   ? Time 4   ? Period Weeks   ? Status On-going   ? Target Date 12/28/21   ?  ? PT SHORT TERM GOAL #2  ? Title Increase AROM Rt knee to 0 deg extension; 90 or greater flexion   ? Time 4   ? Period Weeks   ? Status Partially Met   ? Target Date 12/28/21   ?  ? PT SHORT TERM GOAL #3  ? Title Independent with gait with least restrictive assistive device - weight bearing as allowed by MD   ? Time 4   ? Period Weeks   ? Status Achieved   ? Target Date 12/28/21   ?  ? PT SHORT TERM GOAL #4  ? Title Independent in initial HEP including aquatic exercises as indicated   ? Time 6   ? Period Weeks   ? Status Achieved   ? Target Date 01/11/22   ? ?  ?  ? ?  ? ? ? ? PT Long Term  Goals - 02/16/22 1153   ? ?  ? PT LONG TERM GOAL #1  ? Title Increase AROM Rt knee to WFL's allowing patient to bend knee for all transfers and transitional movements necessary for return to work(RTW)   ? Status Achieved   ?  ? PT LONG TERM GOAL #2  ? Title Rt LE strength 4+/5 to 5/5   ? Status On-going   ?  ? PT LONG TERM GOAL #3  ? Title Independent ambulation without assistive device with good gait pattern allowing patient to ambulate for community and work distances for RTW   ? Status Achieved   ?  ? PT LONG TERM GOAL #4  ? Title Independent in HEP including aquatic program prior to discharge   ? Status Achieved   ?  ? PT LONG TERM GOAL #5  ? Title Improve functional limitation score to 64   ? Status Achieved   ? ?  ?  ? ?  ? ? ? ? ? ? ? ? Plan - 02/16/22 1226   ? ? Clinical Impression Statement Pt has improved ROM and strength and is ready to d/c to HEP   ? PT Next Visit Plan d/c   ? PT Home Exercise Plan [id number]   ? Consulted and Agree with Plan of Care Patient   ? ?  ?  ? ?  ? ? ?Patient will benefit from skilled therapeutic intervention in order to improve the following deficits and impairments:    ? ?Visit Diagnosis: ?Stiffness of right knee ? ?Muscle weakness (generalized) ? ?Other abnormalities of gait and mobility ? ?Other symptoms and signs involving the musculoskeletal system ? ? ? ? ?Problem List ?Patient Active Problem List  ? Diagnosis Date Noted  ? Positive colorectal cancer screening using Cologuard test 12/25/2021  ? Fracture of patella, right, closed  10/10/2021  ? Hypertriglyceridemia 05/18/2014  ? Prediabetes 05/18/2014  ? Annual physical exam 05/17/2014  ? LPRD (laryngopharyngeal reflux disease) 04/10/2013  ? Smoker 12/04/2012  ? Hypertension 12/04/2012  ? Left L5 lumbar radiculopathy 11/04/2012  ?PHYSICAL THERAPY DISCHARGE SUMMARY ? ?Visits from Start of Care: 22 ? ?Current functional level related to goals / functional outcomes: ?Pt has improved strength, gait and ROM ?  ?Remaining  deficits: ?ROM ?  ?Education / Equipment: ?HEP  ? ?Patient agrees to discharge. Patient goals were partially met. Patient is being discharged due to being pleased with the current functional level. ? ? ?Miakoda Mcmillion, PT ?02/16/2022, 12:28 PM ? ?[place] ?[facility] ?[place] ?[place], [place], [address] ?Phone: [phone]   Fax:  [phone] ? ?Name: Nicholas Pearson ?MRN: [id number] ?Date of Birth: June 28, 1971 ? ? ? ?

## 2022-05-19 ENCOUNTER — Other Ambulatory Visit: Payer: Self-pay | Admitting: Sports Medicine

## 2022-05-19 DIAGNOSIS — I10 Essential (primary) hypertension: Secondary | ICD-10-CM

## 2022-06-05 ENCOUNTER — Ambulatory Visit: Payer: 59 | Admitting: Sports Medicine

## 2022-12-04 ENCOUNTER — Ambulatory Visit: Payer: Self-pay | Admitting: Family Medicine

## 2022-12-04 ENCOUNTER — Encounter: Payer: Self-pay | Admitting: Family Medicine

## 2022-12-04 VITALS — BP 107/74 | HR 102 | Ht >= 80 in | Wt 231.0 lb

## 2022-12-04 DIAGNOSIS — R195 Other fecal abnormalities: Secondary | ICD-10-CM

## 2022-12-04 LAB — CBC WITH DIFFERENTIAL/PLATELET
Absolute Monocytes: 319 cells/uL (ref 200–950)
Basophils Absolute: 51 cells/uL (ref 0–200)
Basophils Relative: 0.9 %
Eosinophils Absolute: 399 cells/uL (ref 15–500)
Eosinophils Relative: 7 %
HCT: 25.1 % — ABNORMAL LOW (ref 38.5–50.0)
Hemoglobin: 8.5 g/dL — ABNORMAL LOW (ref 13.2–17.1)
Lymphs Abs: 2246 cells/uL (ref 850–3900)
MCH: 29.8 pg (ref 27.0–33.0)
MCHC: 33.9 g/dL (ref 32.0–36.0)
MCV: 88.1 fL (ref 80.0–100.0)
MPV: 10.3 fL (ref 7.5–12.5)
Monocytes Relative: 5.6 %
Neutro Abs: 2685 cells/uL (ref 1500–7800)
Neutrophils Relative %: 47.1 %
Platelets: 257 10*3/uL (ref 140–400)
RBC: 2.85 10*6/uL — ABNORMAL LOW (ref 4.20–5.80)
RDW: 13.1 % (ref 11.0–15.0)
Total Lymphocyte: 39.4 %
WBC: 5.7 10*3/uL (ref 3.8–10.8)

## 2022-12-04 LAB — BASIC METABOLIC PANEL
BUN: 14 mg/dL (ref 7–25)
CO2: 28 mmol/L (ref 20–32)
Calcium: 9.6 mg/dL (ref 8.6–10.3)
Chloride: 104 mmol/L (ref 98–110)
Creat: 1.05 mg/dL (ref 0.60–1.29)
Glucose, Bld: 83 mg/dL (ref 65–99)
Potassium: 3.6 mmol/L (ref 3.5–5.3)
Sodium: 139 mmol/L (ref 135–146)

## 2022-12-04 NOTE — Assessment & Plan Note (Addendum)
Likely GI bleed related to NSAID use.  Check a stat CBC and BMP.  Recommend increasing Prilosec to twice daily dosing.  Addendum: Results of stat CBC returned with hemoglobin of 8.5 which is about half of his baseline.  His asymptomatic at this time but concern for further blood loss given his continued dark stools.  I contacted the patient and recommend proceeding to the ED for continued monitoring as well as consideration of urgent endoscopy/colonoscopy.

## 2022-12-04 NOTE — Progress Notes (Signed)
RYLON Pearson - 51 y.o. male MRN [id number]  Date of birth: 1971-09-14  Subjective Chief Complaint  Patient presents with   Melena    HPI Nicholas Pearson is a 51 y.o. male here today with complaint of dark stools.  Reports having some back pain over the weekend and took 3 ibuprofen as well as Goody powders.  PRS later had GI upset with vomiting.  Noted dark, coffee-ground appearing emesis.  Following day noted that his stool was dark and sticky appearing.  He is not having increased stool frequency but stool continues to be dark and sticky appearing.  He does not have any continued abdominal pain or nausea.  He has started Prilosec over-the-counter.  He denies any continued nausea or vomiting.  He has not had any dizziness, shortness of breath, chest pain or fatigue.  ROS:  A comprehensive ROS was completed and negative except as noted per HPI   Allergies  Allergen Reactions   Shellfish Allergy Shortness Of Breath, Itching and Swelling    Past Medical History:  Diagnosis Date   Hypertension    Patellar tendon rupture    right    Past Surgical History:  Procedure Laterality Date   KNEE SURGERY     left   PATELLAR TENDON REPAIR Right 10/17/2021   Procedure: RIGHT PATELLA TENDON REPAIR;  Surgeon: Teryl Lucy, MD;  Location: [facility];  Service: Orthopedics;  Laterality: Right;   WISDOM TOOTH EXTRACTION      Social History   Socioeconomic History   Marital status: Married    Spouse name: Not on file   Number of children: Not on file   Years of education: Not on file   Highest education level: Not on file  Occupational History   Not on file  Tobacco Use   Smoking status: Former    Types: Cigars    Quit date: 12/17/2020    Years since quitting: 1.9    Passive exposure: Never   Smokeless tobacco: Never   Tobacco comments:    Smokes approx 2-3/day  Substance and Sexual Activity   Alcohol use: Yes    Comment: social   Drug use: No   Sexual  activity: Yes  Other Topics Concern   Not on file  Social History Narrative   Not on file   Social Determinants of Health   Financial Resource Strain: Not on file  Food Insecurity: Not on file  Transportation Needs: Not on file  Physical Activity: Not on file  Stress: Not on file  Social Connections: Not on file    History reviewed. No pertinent family history.  Health Maintenance  Topic Date Due   Hepatitis C Screening  Never done   COLONOSCOPY (Pts 45-66yrs Insurance coverage will need to be confirmed)  Never done   INFLUENZA VACCINE  03/17/2023 (Originally 07/17/2022)   DTaP/Tdap/Td (2 - Td or Tdap) 04/15/2028   HIV Screening  Completed   HPV VACCINES  Aged Out   COVID-19 Vaccine  Discontinued     ----------------------------------------------------------------------------------------------------------------------------------------------------------------------------------------------------------------- Physical Exam BP 107/74 (BP Location: Left Arm, Patient Position: Sitting, Cuff Size: Large)   Pulse (!) 102   Ht 6\' 8"  (2.032 m)   Wt 231 lb (104.8 kg)   SpO2 100%   BMI 25.38 kg/m   Physical Exam Constitutional:      Appearance: Normal appearance.  HENT:     Head: Normocephalic and atraumatic.  Eyes:     General: No scleral icterus. Cardiovascular:  Rate and Rhythm: Normal rate and regular rhythm.  Pulmonary:     Effort: Pulmonary effort is normal.     Breath sounds: Normal breath sounds.  Abdominal:     General: Bowel sounds are normal. There is no distension.     Palpations: Abdomen is soft.     Tenderness: There is no abdominal tenderness.  Musculoskeletal:     Cervical back: Neck supple.  Neurological:     Mental Status: He is alert.  Psychiatric:        Mood and Affect: Mood normal.        Behavior: Behavior normal.      ------------------------------------------------------------------------------------------------------------------------------------------------------------------------------------------------------------------- Assessment and Plan  Dark stools Likely GI bleed related to NSAID use.  Check a stat CBC and BMP.  Recommend increasing Prilosec to twice daily dosing.  Addendum: Results of stat CBC returned with hemoglobin of 8.5 which is about half of his baseline.  His asymptomatic at this time but concern for further blood loss given his continued dark stools.  I contacted the patient and recommend proceeding to the ED for continued monitoring as well as consideration of urgent endoscopy/colonoscopy.   No orders of the defined types were placed in this encounter.   No follow-ups on file.    This visit occurred during the SARS-CoV-2 public health emergency.  Safety protocols were in place, including screening questions prior to the visit, additional usage of staff PPE, and extensive cleaning of exam room while observing appropriate contact time as indicated for disinfecting solutions.

## 2022-12-04 NOTE — Patient Instructions (Signed)
Take prilosec twice per day.  We'll be in touch with lab results.  If having increase frequency of stools, increase in abdominal pain-Go to the hospital

## 2022-12-06 ENCOUNTER — Telehealth: Payer: Self-pay

## 2022-12-06 NOTE — Telephone Encounter (Signed)
Transition Care Management Follow-up Telephone Call Date of discharge and from where: 12/06/22; [facility] How have you been since you were released from the hospital? Improving Any questions or concerns? Yes; questions on why he was advised to ask about taking Mucinex   Items Reviewed: Did the pt receive and understand the discharge instructions provided? Yes  Medications obtained and verified? Yes  Other?  Na/  Any new allergies since your discharge? No  Dietary orders reviewed? Yes Do you have support at home? Yes   Home Care and Equipment/Supplies: Were home health services ordered? no If so, what is the name of the agency? N/a   Has the agency set up a time to come to the patient's home? not applicable Were any new equipment or medical supplies ordered?  No What is the name of the medical supply agency? N/a Were you able to get the supplies/equipment? not applicable Do you have any questions related to the use of the equipment or supplies? No  Functional Questionnaire: (I = Independent and D = Dependent) ADLs: I  Bathing/Dressing- I  Meal Prep- I  Eating- I  Maintaining continence- I  Transferring/Ambulation- I  Managing Meds- I  Follow up appointments reviewed:  PCP Hospital f/u appt confirmed? Yes  Scheduled to see Dr. Benjamin Stain on 12/19/22 @ 10:30, also added to waitlist . Specialist Hospital f/u appt confirmed? No  Scheduled to see GI  Are transportation arrangements needed? No  If their condition worsens, is the pt aware to call PCP or go to the Emergency Dept.? Yes Was the patient provided with contact information for the PCP's office or ED? Yes Was to pt encouraged to call back with questions or concerns? Yes

## 2022-12-15 IMAGING — CT CT KNEE*R* W/O CM
3 of 7 series · 10 of 33 positions shown, 11 images · non-contrast
Comparison: None.

CLINICAL DATA: Patellar fracture

EXAM:
CT OF THE RIGHT KNEE WITHOUT CONTRAST
TECHNIQUE: Multidetector CT imaging of the RIGHT knee was performed according
to the standard protocol. Multiplanar CT image reconstructions were
also generated.

[Series 2: axial bone · axial · 0.42mm/px · z∈[-940,-624]mm · 3 of 159 slices shown, 4 images]
[im 1/159  soft-tissue]
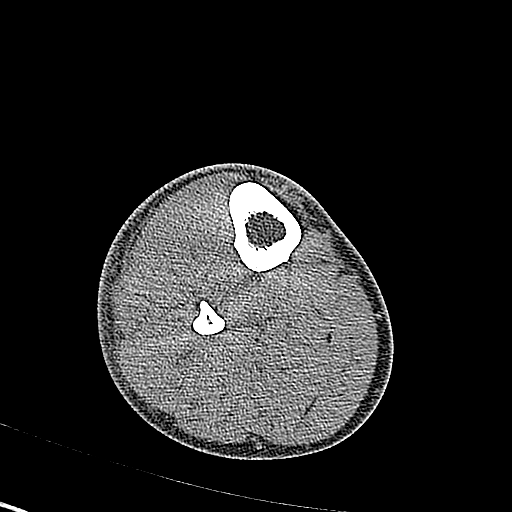
[im 1/159  bone]
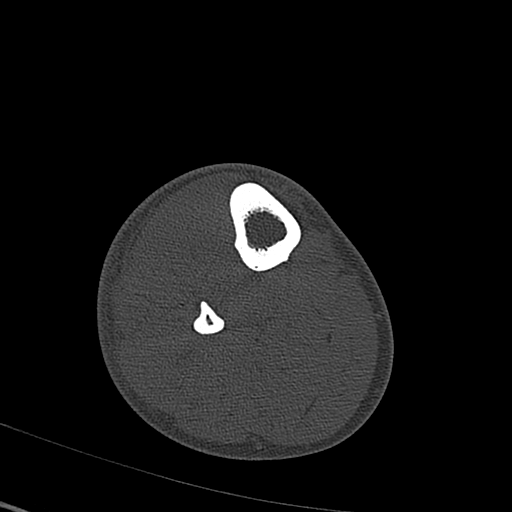
[im 80/159  bone]
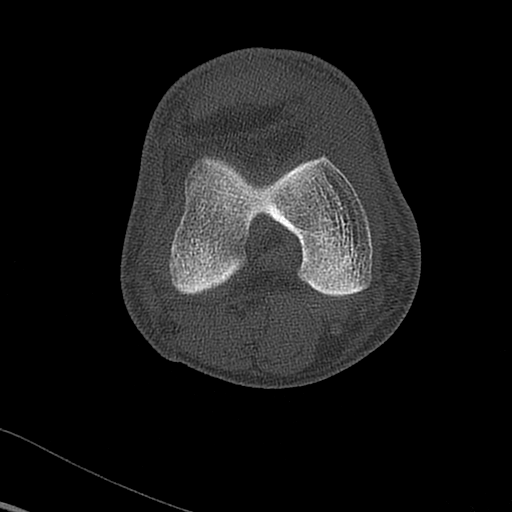
[im 159/159  bone]
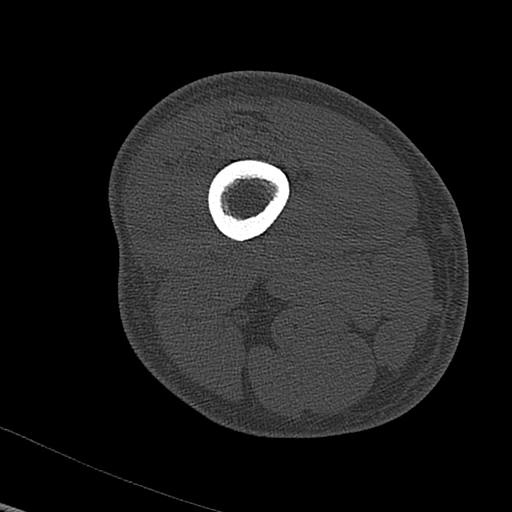

[Series 4: sagittal bone · sagittal · 0.35mm/px · 5 of 79 slices shown]
[im 12/79  bone]
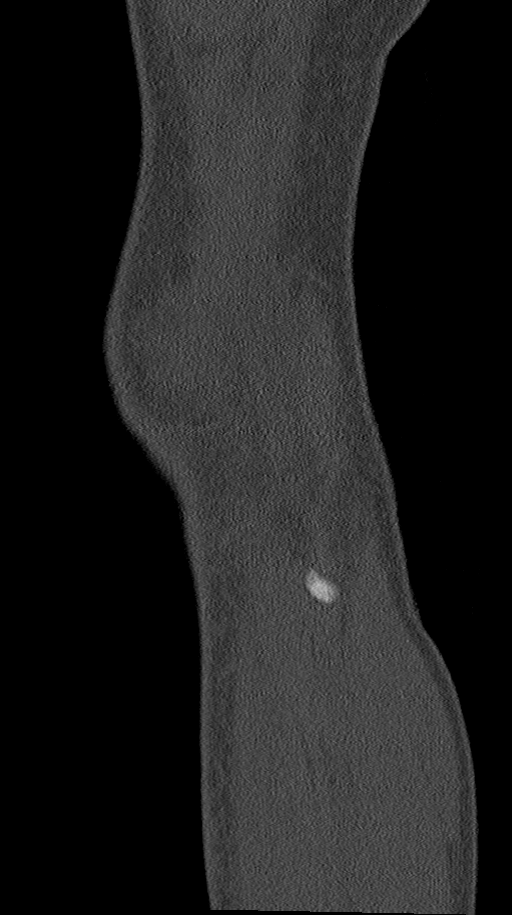
[im 23/79  bone]
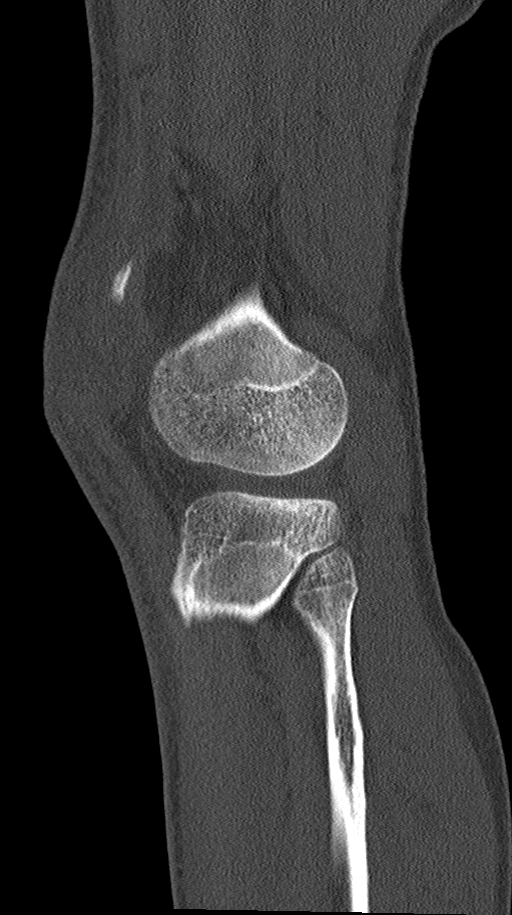
[im 34/79  bone]
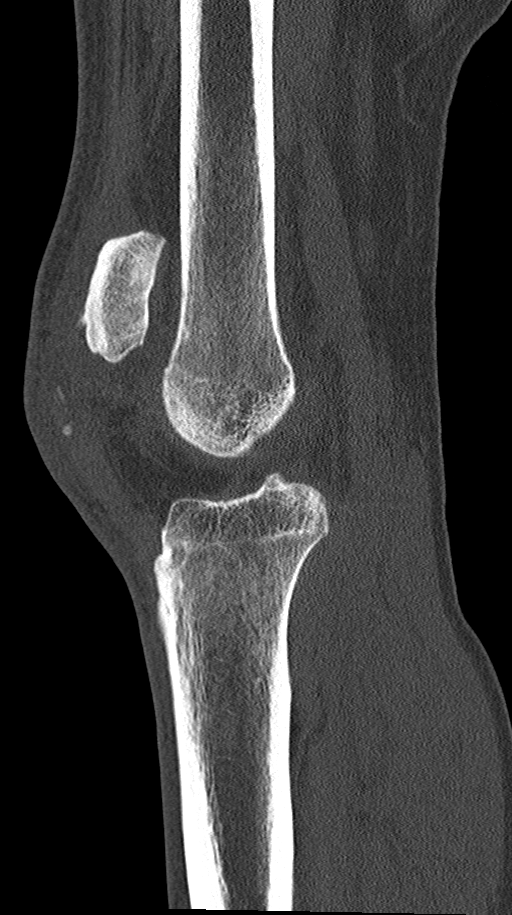
[im 45/79  bone]
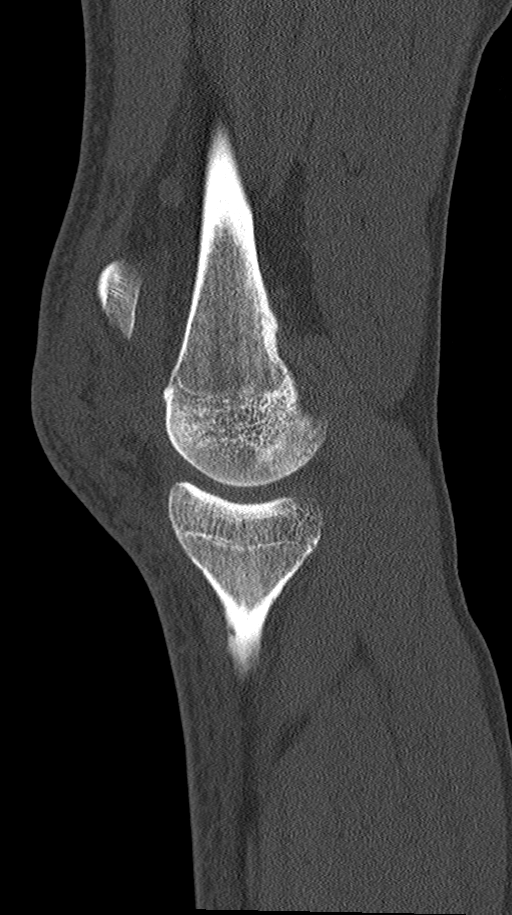
[im 56/79  bone]
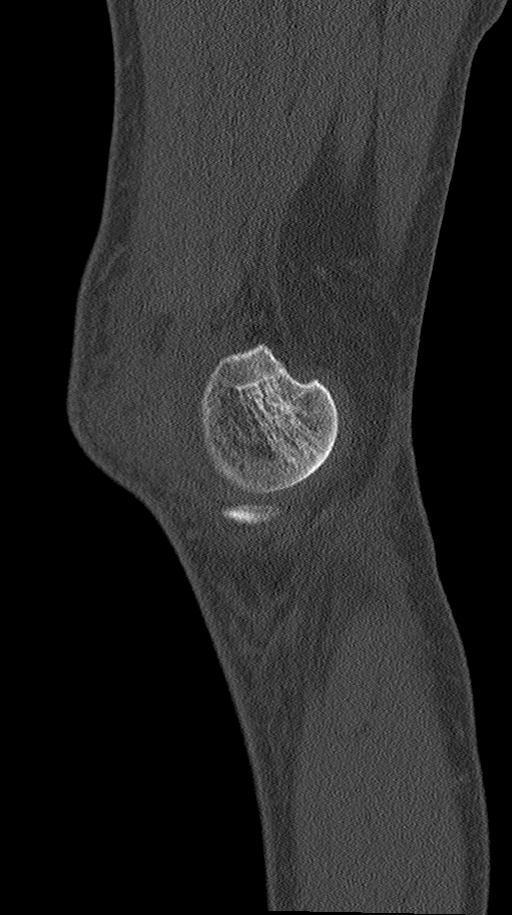

[Series 5: coronal st · coronal · 0.38mm/px · 2 of 90 slices shown]
[im 30/90  bone]
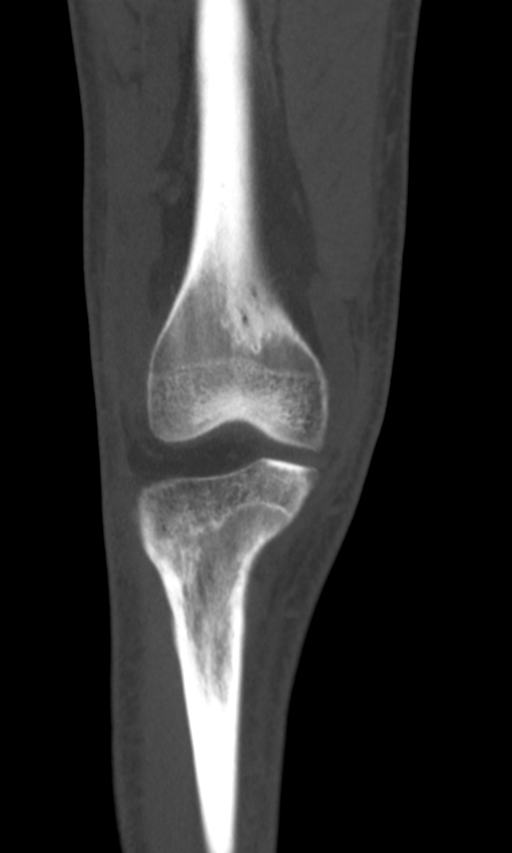
[im 60/90  bone]
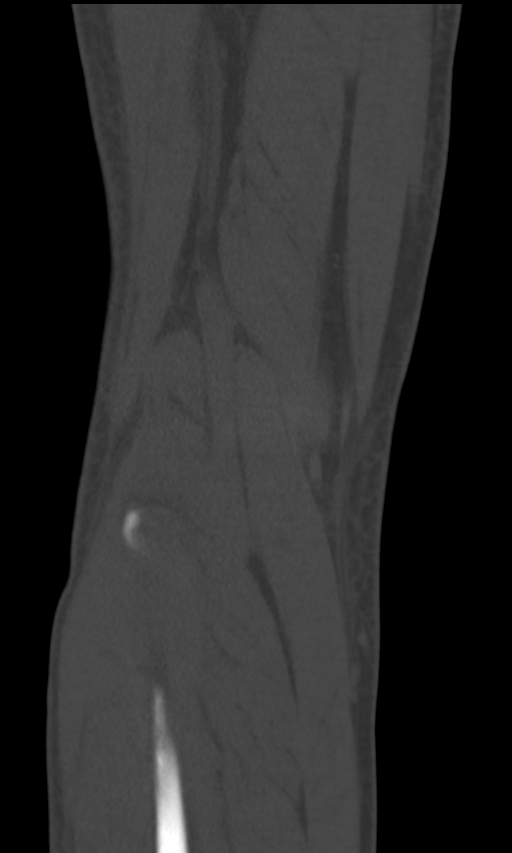

[10 of 33 positions shown; findings below may reference images not displayed]

FINDINGS: Bones/Joint/Cartilage

Comminuted displaced fracture of the lower pole of the patella. The
proximal patella is superiorly displaced. No intra-articular osseous
fragment or significant joint effusion.

Ligaments

Suboptimally assessed by CT.

Muscles and Tendons

There is thickening and edema of the proximal attachment of the
patellar tendon concerning for avulsion fracture

Soft tissues

Mild soft tissue edema about the inferior pole of the patella
IMPRESSION: Comminuted fracture of the lower pole of the patella with associated
avulsion of the proximal attachment of patellar tendon and
surrounding soft tissue edema

## 2022-12-19 ENCOUNTER — Encounter: Payer: Self-pay | Admitting: Sports Medicine

## 2022-12-19 ENCOUNTER — Ambulatory Visit: Payer: PRIVATE HEALTH INSURANCE | Admitting: Sports Medicine

## 2022-12-19 VITALS — BP 165/107 | HR 88 | Ht >= 80 in | Wt 235.0 lb

## 2022-12-19 DIAGNOSIS — K279 Peptic ulcer, site unspecified, unspecified as acute or chronic, without hemorrhage or perforation: Secondary | ICD-10-CM | POA: Diagnosis not present

## 2022-12-19 DIAGNOSIS — M5416 Radiculopathy, lumbar region: Secondary | ICD-10-CM

## 2022-12-19 MED ORDER — FERROUS SULFATE 325 (65 FE) MG PO TBEC: 325.0000 mg | DELAYED_RELEASE_TABLET | Freq: Three times a day (TID) | ORAL | 11 refills | Status: DC

## 2022-12-19 MED ORDER — PANTOPRAZOLE SODIUM 40 MG PO TBEC: 40.0000 mg | DELAYED_RELEASE_TABLET | Freq: Two times a day (BID) | ORAL | 1 refills | Status: DC

## 2022-12-19 NOTE — Progress Notes (Addendum)
    Procedures performed today:    None.  Independent interpretation of notes and tests performed by another provider:   None.  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 Treated aggressively with acid blockers, Goody powder discontinued and feeling a lot better. No further coffee-ground emesis or dark or tarry stools, we will go ahead and check CBC, iron indices, continue pantoprazole twice a day. Return to see me as needed for this.  Left L5 lumbar radiculopathy We did discuss his back pain again today, he had an MRI back in 2014 that showed a large disc protrusion, did okay with epidurals and conservative treatment in the past, he is starting to have increasing cramping in both legs, adding some home conditioning, we can discuss this again if not improving over the next 6 to 8 weeks.  I spent 30 minutes of total time managing this patient today, this includes chart review, face to face, and non-face to face time.  ____________________________________________ Gwen Her. Dianah Field, M.D., ABFM., CAQSM., AME. Primary Care and Sports Medicine [facility]  Adjunct Professor of [employer]  Risk manager

## 2022-12-19 NOTE — Assessment & Plan Note (Signed)
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 Treated aggressively with acid blockers, Goody powder discontinued and feeling a lot better. No further coffee-ground emesis or dark or tarry stools, we will go ahead and check CBC, iron indices, continue pantoprazole twice a day. Return to see me as needed for this.

## 2022-12-19 NOTE — Assessment & Plan Note (Signed)
We did discuss his back pain again today, he had an MRI back in 2014 that showed a large disc protrusion, did okay with epidurals and conservative treatment in the past, he is starting to have increasing cramping in both legs, adding some home conditioning, we can discuss this again if not improving over the next 6 to 8 weeks.

## 2022-12-19 NOTE — Addendum Note (Signed)
Addended by: Silverio Decamp on: 12/19/2022 10:59 AM   Modules accepted: Level of Service

## 2022-12-20 LAB — IRON,TIBC AND FERRITIN PANEL
%SAT: 10 % (calc) — ABNORMAL LOW (ref 20–48)
Ferritin: 16 ng/mL — ABNORMAL LOW (ref 38–380)
Iron: 40 ug/dL — ABNORMAL LOW (ref 50–180)
TIBC: 400 mcg/dL (calc) (ref 250–425)

## 2022-12-20 LAB — CBC
HCT: 33.5 % — ABNORMAL LOW (ref 38.5–50.0)
Hemoglobin: 11 g/dL — ABNORMAL LOW (ref 13.2–17.1)
MCH: 28.3 pg (ref 27.0–33.0)
MCHC: 32.8 g/dL (ref 32.0–36.0)
MCV: 86.1 fL (ref 80.0–100.0)
MPV: 9.4 fL (ref 7.5–12.5)
Platelets: 360 10*3/uL (ref 140–400)
RBC: 3.89 10*6/uL — ABNORMAL LOW (ref 4.20–5.80)
RDW: 13.8 % (ref 11.0–15.0)
WBC: 4.8 10*3/uL (ref 3.8–10.8)

## 2022-12-20 LAB — RETICULOCYTES
ABS Retic: 116700 cells/uL — ABNORMAL HIGH (ref 25000–90000)
Retic Ct Pct: 3 %

## 2022-12-20 LAB — B12 AND FOLATE PANEL
Folate: 19.1 ng/mL
Vitamin B-12: 657 pg/mL (ref 200–1100)

## 2023-02-14 ENCOUNTER — Other Ambulatory Visit: Payer: Self-pay | Admitting: Pharmacist

## 2023-02-14 NOTE — Progress Notes (Signed)
Patient appearing on report for [employer] [facility] - Hypertension Control report due to last documented ambulatory blood pressure of 165/107 on 12/19/22. Next appointment with PCP is 06/19/23   Outreached patient to discuss hypertension control and medication management.   Current antihypertensives: lisinopril-hctz 20-'25mg'$  daily, amlodipine '5mg'$  daily  Patient has an automated upper arm home BP machine.  Current blood pressure readings: 130s/90s at home   Patient denies hypotensive signs and symptoms including dizziness, lightheadedness.  Patient denies hypertensive symptoms including headache, chest pain, shortness of breath.   Assessment/Plan: - Currently controlled - - Reviewed appropriate administration of medication regimen - Reviewed appropriate home BP monitoring technique (avoid caffeine, smoking, and exercise for 30 minutes before checking, rest for at least 5 minutes before taking BP, sit with feet flat on the floor and back against a hard surface, uncross legs, and rest arm on flat surface) - Reviewed to check blood pressure 2x weekly, document, and provide at next provider visit - Discussed dietary modifications, such as reduced salt intake, focus on whole grains, vegetables, lean proteins - Discussed goal of 150 minutes of moderate intensity physical activity weekly - Recommend continue current regimen  Larinda Buttery, PharmD Clinical Pharmacist [facility] [phone]

## 2023-02-14 NOTE — Patient Instructions (Signed)
Nicholas Pearson,  Thank you for speaking with me today! As discussed, keep taking medications and checking blood pressure at home. You can bring your blood pressure cuff to your appointment in July and we can check it against our readings.  Below are some helpful tips with checking blood pressure:  Check your blood pressure twice weekly, and any time you have concerning symptoms like headache, chest pain, dizziness, shortness of breath, or vision changes.   Our goal is less than 140/90.  To appropriately check your blood pressure, make sure you do the following:  1) Avoid caffeine, exercise, or tobacco products for 30 minutes before checking. Empty your bladder. 2) Sit with your back supported in a flat-backed chair. Rest your arm on something flat (arm of the chair, table, etc). 3) Sit still with your feet flat on the floor, resting, for at least 5 minutes.  4) Check your blood pressure. Take 1-2 readings.  5) Write down these readings and bring with you to any provider appointments.  Bring your home blood pressure machine with you to a provider's office for accuracy comparison at least once a year.   Make sure you take your blood pressure medications before you come to any office visit, even if you were asked to fast for labs.   Take care, Luana Shu, PharmD Clinical Pharmacist [facility] At [facility] [phone]

## 2023-04-01 ENCOUNTER — Encounter: Payer: Self-pay | Admitting: *Deleted

## 2023-05-01 ENCOUNTER — Ambulatory Visit
Admission: EM | Admit: 2023-05-01 | Discharge: 2023-05-01 | Disposition: A | Discharge status: Home / Self Care | Payer: PRIVATE HEALTH INSURANCE | Attending: Physician Assistant | Admitting: Physician Assistant

## 2023-05-01 DIAGNOSIS — I1A0 Resistant hypertension: Secondary | ICD-10-CM

## 2023-05-01 MED ORDER — METOPROLOL TARTRATE 25 MG PO TABS: 25.0000 mg | ORAL_TABLET | Freq: Two times a day (BID) | ORAL | 0 refills | Status: DC

## 2023-05-01 NOTE — ED Provider Notes (Signed)
[facility]    CSN: [id number] Arrival date & time: 05/01/23  1527      History   Chief Complaint Chief Complaint  Patient presents with   Hypertension    HPI Nicholas Pearson is a 52 y.o. male.   Patient complains of elevated blood pressure.  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  Patient was sent here due to elevated blood pressure.  Patient is not currently having any symptoms he denies any headache he denies any chest pain he is not having any shortness of breath.  Patient is currently on lisinopril/hydrochlorothiazide and amlodipine.  Patient reports he did take his medications this morning.  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  Patient reports that he was just at [facility] he states that he was eating pizza and other high sodium foods.   Hypertension    Past Medical History:  Diagnosis Date   Hypertension    Patellar tendon rupture    right    Patient Active Problem List   Diagnosis Date Noted   Peptic ulcer disease 12/19/2022   Dark stools 12/25/2021   Fracture of patella, right, closed 10/10/2021   Hypertriglyceridemia 05/18/2014   Prediabetes 05/18/2014   Annual physical exam 05/17/2014   LPRD (laryngopharyngeal reflux disease) 04/10/2013   Hypertension 12/04/2012   Left L5 lumbar radiculopathy 11/04/2012    Past Surgical History:  Procedure Laterality Date   KNEE SURGERY     left   PATELLAR TENDON REPAIR Right 10/17/2021   Procedure: RIGHT PATELLA TENDON REPAIR;  Surgeon: Teryl Lucy, MD;  Location: [facility];  Service: Orthopedics;  Laterality: Right;   WISDOM TOOTH EXTRACTION         Home Medications    Prior to Admission medications   Medication Sig Start Date End Date Taking? Authorizing Provider  metoprolol tartrate (LOPRESSOR) 25 MG tablet Take 1  tablet (25 mg total) by mouth 2 (two) times daily. 05/01/23 05/31/23 Yes Cheron Schaumann K, PA-C  amLODipine (NORVASC) 5 MG tablet TAKE 1 TABLET BY MOUTH EVERY DAY 05/21/22   Monica Becton, MD  ferrous sulfate 325 (65 FE) MG EC tablet Take 1 tablet (325 mg total) by mouth 3 (three) times daily with meals. 12/19/22   Monica Becton, MD  lisinopril-hydrochlorothiazide (ZESTORETIC) 20-25 MG tablet TAKE 1 TABLET BY MOUTH EVERY DAY 05/21/22   Monica Becton, MD  pantoprazole (PROTONIX) 40 MG tablet Take 1 tablet (40 mg total) by mouth 2 (two) times daily before a meal. 12/19/22   Monica Becton, MD    Family History History reviewed. No pertinent family history.  Social History Social History   Tobacco Use   Smoking status: Former    Types: Cigars    Quit date: 12/17/2020    Years since quitting: 2.3    Passive exposure: Never   Smokeless tobacco: Never   Tobacco comments:    Smokes approx 2-3/day  Substance Use Topics   Alcohol use: Yes    Comment: social   Drug use: No     Allergies   Shellfish allergy   Review of Systems Review of Systems  All other systems reviewed and are negative.    Physical Exam Triage Vital Signs ED Triage Vitals  Enc Vitals Group     BP 05/01/23 1544 Marland Kitchen)  192/128     Pulse Rate 05/01/23 1544 75     Resp 05/01/23 1544 16     Temp 05/01/23 1544 (!) 97.5 F (36.4 C)     Temp Source 05/01/23 1544 Oral     SpO2 05/01/23 1544 100 %     Weight --      Height --      Head Circumference --      Peak Flow --      Pain Score 05/01/23 1541 0     Pain Loc --      Pain Edu? --      Excl. in GC? --    No data found.  Updated Vital Signs BP (!) 192/128 (BP Location: Right Arm)   Pulse 75   Temp (!) 97.5 F (36.4 C) (Oral)   Resp 16   SpO2 100%   Visual Acuity Right Eye Distance:   Left Eye Distance:   Bilateral Distance:    Right Eye Near:   Left Eye Near:    Bilateral Near:     Physical Exam Vitals and nursing  note reviewed.  Constitutional:      Appearance: He is well-developed.  HENT:     Head: Normocephalic.  Cardiovascular:     Rate and Rhythm: Normal rate.  Pulmonary:     Effort: Pulmonary effort is normal.  Abdominal:     General: There is no distension.  Musculoskeletal:        General: Normal range of motion.     Cervical back: Normal range of motion.  Neurological:     General: No focal deficit present.     Mental Status: He is alert and oriented to person, place, and time.      UC Treatments / Results  Labs (all labs ordered are listed, but only abnormal results are displayed) Labs Reviewed - No data to display  EKG   Radiology No results found.  Procedures Procedures (including critical care time)  Medications Ordered in UC Medications - No data to display  Initial Impression / Assessment and Plan / UC Course  I have reviewed the triage vital signs and the nursing notes.  Pertinent labs & imaging results that were available during my care of the patient were reviewed by me and considered in my medical decision making (see chart for details).     MDM: Patient is on 3 drug therapy at this point.  I will try adding metoprolol.  I advised patient he needs to take his medication as prescribed.  He is counseled on low-sodium/-eating plan for hypertension.  Patient is advised to increase his exercise.  He is advised to make an appointment with his primary care physician for reevaluation of his blood pressure medicines.  Patient is advised if he has headache or chest pain he should go to the emergency department for further evaluation Final Clinical Impressions(s) / UC Diagnoses   Final diagnoses:  Resistant hypertension     Discharge Instructions      Watch your diet carefully.  Avoid high sodium foods and salt.  Take your medications as prescribed.  Increase your exercise.     ED Prescriptions     Medication Sig Dispense Auth. Provider   metoprolol  tartrate (LOPRESSOR) 25 MG tablet Take 1 tablet (25 mg total) by mouth 2 (two) times daily. 60 tablet Elson Areas, [place]      An After Visit Summary was printed and given to the patient.  PDMP not reviewed this encounter.   Elson Areas, [place] 05/01/23 1928

## 2023-05-01 NOTE — ED Triage Notes (Addendum)
Pt c/o hypertension. Was downstairs at [facility] for DOT physical. Referred to UC due to BP reading was 181/124. Takes lisinopril and amlodipine daily. Denies HA, CP or SOB. 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

## 2023-05-01 NOTE — Discharge Instructions (Addendum)
Watch your diet carefully.  Avoid high sodium foods and salt.  Take your medications as prescribed.  Increase your exercise.

## 2023-05-02 ENCOUNTER — Encounter: Payer: Self-pay | Admitting: Medical-Surgical

## 2023-05-02 ENCOUNTER — Ambulatory Visit: Payer: PRIVATE HEALTH INSURANCE | Admitting: Medical-Surgical

## 2023-05-02 VITALS — BP 184/124 | HR 82 | Ht >= 80 in | Wt 233.6 lb

## 2023-05-02 DIAGNOSIS — I10 Essential (primary) hypertension: Secondary | ICD-10-CM | POA: Diagnosis not present

## 2023-05-02 MED ORDER — LISINOPRIL-HYDROCHLOROTHIAZIDE 20-25 MG PO TABS: 1.0000 | ORAL_TABLET | Freq: Every day | ORAL | 1 refills | Status: DC

## 2023-05-02 NOTE — Progress Notes (Signed)
        Established patient visit  History, exam, impression, and plan:  1. Uncontrolled hypertension Pleasant 52 year old male accompanied by his wife in with reports of very high BP noted on DOT physical yesterday. Was sent to UC where BP was 190s/130s. Today, admits that he has  not been taking Amlodipine 5mg  and Lisinopril-HCTZ 20-25mg  daily as prescribed. Is inconsistent with taking the medications, some days taking one or the other, occasionally taking both. Admits to poor dietary habit and eating fast food regularly. Recently went to [facility] and was eating high sodium foods. Denies SOB, CP, LE edema, palpitations, dizziness, and vision changes. HRR, S1/S2 normal. Lungs CTA, respirations even and unlabored. 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 Not regularly checking BP at home. Discussed importance of medication adherence and risks of uncontrolled BP. He was started on Metoprolol and took his first dose last night. Recommend working to take his BP medication every day without missed doses and follow a low sodium diet. Check BP at home with a goal of 130/80 or less. If still elevated in 2 weeks, consider increasing Amlodipine dose. Reviewed emergency precautions.   Procedures performed this visit: None.  Return in about 2 weeks (around 05/16/2023) for nurse visit for BP check.  __________________________________ Thayer Ohm, DNP, APRN, FNP-BC Primary Care and Sports Medicine [facility]

## 2023-05-14 ENCOUNTER — Other Ambulatory Visit: Payer: Self-pay | Admitting: Sports Medicine

## 2023-05-14 DIAGNOSIS — K279 Peptic ulcer, site unspecified, unspecified as acute or chronic, without hemorrhage or perforation: Secondary | ICD-10-CM

## 2023-05-20 ENCOUNTER — Ambulatory Visit (INDEPENDENT_AMBULATORY_CARE_PROVIDER_SITE_OTHER): Payer: PRIVATE HEALTH INSURANCE | Admitting: Sports Medicine

## 2023-05-20 VITALS — BP 125/83 | HR 78

## 2023-05-20 DIAGNOSIS — I10 Essential (primary) hypertension: Secondary | ICD-10-CM | POA: Diagnosis not present

## 2023-05-20 NOTE — Progress Notes (Signed)
   Established Patient Office Visit  Subjective   Patient ID: Nicholas Pearson, male    DOB: 29-May-1971  Age: 52 y.o. MRN: [id number]  Chief Complaint  Patient presents with   Hypertension    HPI  Nicholas Pearson is here for blood pressure check. Denies chest pain, shortness of breath or dizziness.   ROS    Objective:     BP (!) 135/91   Pulse 78   SpO2 100%    Physical Exam   No results found for any visits on 05/20/23.    The 10-year ASCVD risk score (Arnett DK, et al., 2019) is: 15.9%    Assessment & Plan:  Hypertension - Blood pressure within normal limits. Patient advised to take medication as directed follow up in 3 months with Dr Benjamin Stain.   Problem List Items Addressed This Visit       Unprioritized   Hypertension - Primary    No follow-ups on file.    Esmond Harps, CMA

## 2023-06-19 ENCOUNTER — Ambulatory Visit: Payer: PRIVATE HEALTH INSURANCE | Admitting: Sports Medicine

## 2023-06-21 ENCOUNTER — Ambulatory Visit: Payer: PRIVATE HEALTH INSURANCE | Admitting: Sports Medicine

## 2023-06-21 VITALS — BP 132/88 | HR 67 | Wt 237.0 lb

## 2023-06-21 DIAGNOSIS — L858 Other specified epidermal thickening: Secondary | ICD-10-CM

## 2023-06-21 DIAGNOSIS — I10 Essential (primary) hypertension: Secondary | ICD-10-CM | POA: Diagnosis not present

## 2023-06-21 DIAGNOSIS — Z0000 Encounter for general adult medical examination without abnormal findings: Secondary | ICD-10-CM | POA: Diagnosis not present

## 2023-06-21 NOTE — Progress Notes (Addendum)
    Procedures performed today:    None.  Independent interpretation of notes and tests performed by another provider:   None.  Brief History, Exam, Impression, and Recommendations:    Hypertension Under adequate control, we have not checked routine labs are well, ordering them now.  Cutaneous horn Left forehead cutaneous horn, these can represent squamous cell carcinomas, we will bring him back for excision in a 15-minute slot, he will come fasting in the morning and we can do his labs same day.  Annual physical exam Due for colon cancer screening, he is going to let me know which gastroenterologist he would like to use.    ____________________________________________ Ihor Austin. Benjamin Stain, M.D., ABFM., CAQSM., AME. Primary Care and Sports Medicine [facility]  Adjunct Professor of Family Medicine  [employer] [facility] [employer] [facility] [employer]  Restaurant manager, fast food

## 2023-06-21 NOTE — Assessment & Plan Note (Signed)
Due for colon cancer screening, he is going to let me know which gastroenterologist he would like to use.

## 2023-06-21 NOTE — Assessment & Plan Note (Signed)
Under adequate control, we have not checked routine labs are well, ordering them now.

## 2023-06-21 NOTE — Assessment & Plan Note (Signed)
Left forehead cutaneous horn, these can represent squamous cell carcinomas, we will bring him back for excision in a 15-minute slot, he will come fasting in the morning and we can do his labs same day.

## 2023-07-02 ENCOUNTER — Ambulatory Visit: Payer: PRIVATE HEALTH INSURANCE | Admitting: Sports Medicine

## 2023-07-02 DIAGNOSIS — I10 Essential (primary) hypertension: Secondary | ICD-10-CM

## 2023-07-02 DIAGNOSIS — L858 Other specified epidermal thickening: Secondary | ICD-10-CM

## 2023-07-02 NOTE — Progress Notes (Signed)
    Procedures performed today:    Procedure: Shave biopsy of 0.5 cm cutaneous horn left forehead Risks, benefits, and alternatives explained and consent obtained. Time out conducted. Surface prepped with alcohol. 1cc lidocaine with epinephine infiltrated in a field block. Adequate anesthesia ensured. Area prepped and draped in a sterile fashion. Excision performed with: DermaBlade followed by hyfrecation for hemostasis. Hemostasis achieved. Pt stable.  Independent interpretation of notes and tests performed by another provider:   None.  Brief History, Exam, Impression, and Recommendations:    Cutaneous horn Shave biopsy, sent for pathology, return as needed.    ____________________________________________ Ihor Austin. Benjamin Stain, M.D., ABFM., CAQSM., AME. Primary Care and Sports Medicine [facility]  Adjunct Professor of Family Medicine  [employer]  Restaurant manager, fast food

## 2023-07-02 NOTE — Assessment & Plan Note (Signed)
Shave biopsy, sent for pathology, return as needed.

## 2023-07-03 LAB — HEMOGLOBIN A1C
Hgb A1c MFr Bld: 6 % of total Hgb — ABNORMAL HIGH (ref <5.7)
Mean Plasma Glucose: 126 mg/dL
eAG (mmol/L): 7 mmol/L

## 2023-07-03 LAB — LIPID PANEL
Cholesterol: 199 mg/dL (ref <200)
HDL: 49 mg/dL (ref >=40)
LDL Cholesterol (Calc): 125 mg/dL (calc) — ABNORMAL HIGH
Non-HDL Cholesterol (Calc): 150 mg/dL (calc) — ABNORMAL HIGH (ref <130)
Total CHOL/HDL Ratio: 4.1 (calc) (ref <5.0)
Triglycerides: 142 mg/dL (ref <150)

## 2023-07-03 LAB — CBC
HCT: 42.8 % (ref 38.5–50.0)
Hemoglobin: 14.2 g/dL (ref 13.2–17.1)
MCH: 29.6 pg (ref 27.0–33.0)
MCHC: 33.2 g/dL (ref 32.0–36.0)
MCV: 89.2 fL (ref 80.0–100.0)
MPV: 9.4 fL (ref 7.5–12.5)
Platelets: 247 10*3/uL (ref 140–400)
RBC: 4.8 10*6/uL (ref 4.20–5.80)
RDW: 13.2 % (ref 11.0–15.0)
WBC: 5.4 10*3/uL (ref 3.8–10.8)

## 2023-07-03 LAB — COMPREHENSIVE METABOLIC PANEL
AG Ratio: 1.5 (calc) (ref 1.0–2.5)
ALT: 21 U/L (ref 9–46)
AST: 24 U/L (ref 10–35)
Albumin: 4.5 g/dL (ref 3.6–5.1)
Alkaline phosphatase (APISO): 52 U/L (ref 35–144)
BUN: 11 mg/dL (ref 7–25)
CO2: 28 mmol/L (ref 20–32)
Calcium: 9.7 mg/dL (ref 8.6–10.3)
Chloride: 105 mmol/L (ref 98–110)
Creat: 1.07 mg/dL (ref 0.70–1.30)
Globulin: 3 g/dL (calc) (ref 1.9–3.7)
Glucose, Bld: 92 mg/dL (ref 65–99)
Potassium: 4.5 mmol/L (ref 3.5–5.3)
Sodium: 142 mmol/L (ref 135–146)
Total Bilirubin: 0.4 mg/dL (ref 0.2–1.2)
Total Protein: 7.5 g/dL (ref 6.1–8.1)

## 2023-07-03 LAB — TSH: TSH: 2.73 mIU/L (ref 0.40–4.50)

## 2023-07-09 MED ORDER — LISINOPRIL-HYDROCHLOROTHIAZIDE 20-25 MG PO TABS: 1.0000 | ORAL_TABLET | Freq: Every day | ORAL | 1 refills | Status: DC

## 2023-07-09 MED ORDER — AMLODIPINE BESYLATE 5 MG PO TABS
5.0000 mg | ORAL_TABLET | Freq: Every day | ORAL | 3 refills | Status: DC
Start: 2023-07-09 — End: 2023-10-09

## 2023-07-09 NOTE — Addendum Note (Signed)
Addended by: Carren Rang A on: 07/09/2023 10:33 AM   Modules accepted: Orders

## 2023-10-09 ENCOUNTER — Other Ambulatory Visit: Payer: Self-pay | Admitting: Sports Medicine

## 2023-10-09 DIAGNOSIS — I10 Essential (primary) hypertension: Secondary | ICD-10-CM

## 2024-01-30 ENCOUNTER — Telehealth: Payer: Self-pay | Admitting: Sports Medicine

## 2024-01-30 DIAGNOSIS — Z1211 Encounter for screening for malignant neoplasm of colon: Secondary | ICD-10-CM

## 2024-01-30 NOTE — Telephone Encounter (Signed)
Pt's wife called in requesting a referral for a colonoscopy due to patient turning 52. Please advise

## 2024-01-30 NOTE — Telephone Encounter (Signed)
Done

## 2024-02-15 ENCOUNTER — Encounter: Payer: Self-pay | Admitting: Sports Medicine

## 2024-03-18 ENCOUNTER — Ambulatory Visit (AMBULATORY_SURGERY_CENTER): Payer: PRIVATE HEALTH INSURANCE

## 2024-03-18 VITALS — Ht >= 80 in | Wt 243.0 lb

## 2024-03-18 DIAGNOSIS — Z1211 Encounter for screening for malignant neoplasm of colon: Secondary | ICD-10-CM

## 2024-03-18 MED ORDER — SUFLAVE 178.7 G PO SOLR
1.0000 | Freq: Once | ORAL | 0 refills | Status: AC
Start: 2024-03-18 — End: 2024-03-18

## 2024-03-18 NOTE — Progress Notes (Signed)

## 2024-04-13 ENCOUNTER — Encounter: Payer: Self-pay | Admitting: Internal Medicine

## 2024-04-13 ENCOUNTER — Ambulatory Visit: Payer: Self-pay

## 2024-04-13 ENCOUNTER — Ambulatory Visit: Payer: PRIVATE HEALTH INSURANCE | Admitting: Internal Medicine

## 2024-04-13 VITALS — BP 190/122 | HR 82 | Temp 99.0°F | Resp 12 | Ht >= 80 in | Wt 243.0 lb

## 2024-04-13 DIAGNOSIS — D124 Benign neoplasm of descending colon: Secondary | ICD-10-CM

## 2024-04-13 DIAGNOSIS — D125 Benign neoplasm of sigmoid colon: Secondary | ICD-10-CM

## 2024-04-13 DIAGNOSIS — K648 Other hemorrhoids: Secondary | ICD-10-CM

## 2024-04-13 DIAGNOSIS — D128 Benign neoplasm of rectum: Secondary | ICD-10-CM | POA: Diagnosis not present

## 2024-04-13 DIAGNOSIS — Z1211 Encounter for screening for malignant neoplasm of colon: Secondary | ICD-10-CM | POA: Diagnosis not present

## 2024-04-13 DIAGNOSIS — K5730 Diverticulosis of large intestine without perforation or abscess without bleeding: Secondary | ICD-10-CM

## 2024-04-13 DIAGNOSIS — K635 Polyp of colon: Secondary | ICD-10-CM | POA: Diagnosis not present

## 2024-04-13 MED ORDER — HYDROCORTISONE (PERIANAL) 2.5 % EX CREA: 1.0000 | TOPICAL_CREAM | Freq: Two times a day (BID) | CUTANEOUS | 1 refills | Status: DC

## 2024-04-13 MED ORDER — HYDROCORTISONE (PERIANAL) 2.5 % EX CREA: 1.0000 | TOPICAL_CREAM | Freq: Two times a day (BID) | CUTANEOUS | 1 refills | Status: AC

## 2024-04-13 NOTE — Progress Notes (Signed)
 Vss nad trans to pacu

## 2024-04-13 NOTE — Progress Notes (Signed)
 Called to room to assist during endoscopic procedure.  Patient ID and intended procedure confirmed with present staff. Received instructions for my participation in the procedure from the performing physician.

## 2024-04-13 NOTE — Telephone Encounter (Signed)
 Chief Complaint: sinus pain Symptoms: sinus pain Frequency: a month Pertinent Negatives: Patient denies fever Disposition: [] ED /[] Urgent Care (no appt availability in office) / [x] Appointment(In office/virtual)/ []  [facility]/ [] Home Care/ [] Refused Recommended Disposition /[] [place] [facility]/ []  Follow-up with PCP Additional Notes: pt states sinus congestion and pain. States feels like left ear is clogged and  stinging in bridge of nose. States that sometimes causes migraines. States headache 7/10. States he has not taken anything for headache.   Copied from CRM [id number]. Topic: Clinical - Red Word Triage >> Apr 13, 2024  3:35 PM Jayson Michael wrote: [facility] that prompted transfer to Nurse Triage:  headache, pain sinus congestion.  Patient presents with a headache focused in the bridge of his nose and forehead. He says there is stinging pain in the nostrils.  He also states that he is congested, ear is clogged like it is full of water. He is requesting an acute appointment. Sending to nurse triage for red word ( pain) Reason for Disposition  [1] Sinus congestion (pressure, fullness) AND [2] present > 10 days  Answer Assessment - Initial Assessment Questions 1. LOCATION: "Where does it hurt?"      Headache, nose, ear 2. ONSET: "When did the sinus pain start?"  (e.g., hours, days)      A month 3. SEVERITY: "How bad is the pain?"   (Scale 1-10; mild, moderate or severe)   - MILD (1-3): doesn't interfere with normal activities    - MODERATE (4-7): interferes with normal activities (e.g., work or school) or awakens from sleep   - SEVERE (8-10): excruciating pain and patient unable to do any normal activities        7/10 4. RECURRENT SYMPTOM: "Have you ever had sinus problems before?" If Yes, ask: "When was the last time?" and "What happened that time?"      yes 5. NASAL CONGESTION: "Is the nose blocked?" If Yes, ask: "Can you open it or must you breathe through your mouth?"      yes 6. NASAL DISCHARGE: "Do you have discharge from your nose?" If so ask, "What color?"     clear 7. FEVER: "Do you have a fever?" If Yes, ask: "What is it, how was it measured, and when did it start?"      no 8. OTHER SYMPTOMS: "Do you have any other symptoms?" (e.g., sore throat, cough, earache, difficulty breathing)     Earache, runny nose, headache  Protocols used: Sinus Pain or Congestion-A-AH

## 2024-04-13 NOTE — Progress Notes (Signed)
 GASTROENTEROLOGY PROCEDURE H&P NOTE   Primary Care Physician: Gean Keels, MD    Reason for Procedure:   Colon cancer screening  Plan:    Colonoscopy   Patient is appropriate for endoscopic procedure(s) in the ambulatory ([facility]) setting.  The nature of the procedure, as well as the risks, benefits, and alternatives were carefully and thoroughly reviewed with the patient. Ample time for discussion and questions allowed. The patient understood, was satisfied, and agreed to proceed.     HPI: Nicholas Pearson is a 53 y.o. male who presents for colonoscopy for colon cancer screening. Denies changes in bowel habits or unintentional weight loss. He has seen some scant bright red rectal bleeding over the last week. Denies family history of colon cancer. Father and grandfather had colon polyps. Father did have to get an additional procedure or surgery to resect an advanced polyp at about age 66.  Past Medical History:  Diagnosis Date   GERD (gastroesophageal reflux disease)    Heart murmur    "As a teenager"   Hypertension    Patellar tendon rupture    right    Past Surgical History:  Procedure Laterality Date   KNEE SURGERY     left   PATELLAR TENDON REPAIR Right 10/17/2021   Procedure: RIGHT PATELLA TENDON REPAIR;  Surgeon: Osa Blase, MD;  Location: [facility];  Service: Orthopedics;  Laterality: Right;   WISDOM TOOTH EXTRACTION      Prior to Admission medications   Medication Sig Start Date End Date Taking? Authorizing Provider  amLODipine  (NORVASC ) 5 MG tablet TAKE 1 TABLET (5 MG TOTAL) BY MOUTH DAILY. 10/09/23   Gean Keels, MD  ferrous sulfate  325 (65 FE) MG EC tablet Take 1 tablet (325 mg total) by mouth 3 (three) times daily with meals. Patient not taking: Reported on 03/18/2024 12/19/22   Gean Keels, MD  lisinopril -hydrochlorothiazide  (ZESTORETIC ) 20-25 MG tablet Take 1 tablet by mouth daily. 07/09/23   Gean Keels, MD  loratadine (CLARITIN) 10 MG tablet Take by mouth.    [provider]  metoprolol  tartrate (LOPRESSOR ) 25 MG tablet Take 1 tablet (25 mg total) by mouth 2 (two) times daily. Patient not taking: Reported on 03/18/2024 05/01/23 05/31/23  Sofia, Leslie K, PA-C  pantoprazole  (PROTONIX ) 40 MG tablet TAKE 1 TABLET (40 MG TOTAL) BY MOUTH TWICE A DAY BEFORE MEALS Patient not taking: Reported on 03/18/2024 05/14/23   Gean Keels, MD    Current Outpatient Medications  Medication Sig Dispense Refill   amLODipine  (NORVASC ) 5 MG tablet TAKE 1 TABLET (5 MG TOTAL) BY MOUTH DAILY. 90 tablet 1   ferrous sulfate  325 (65 FE) MG EC tablet Take 1 tablet (325 mg total) by mouth 3 (three) times daily with meals. (Patient not taking: Reported on 03/18/2024) 90 tablet 11   lisinopril -hydrochlorothiazide  (ZESTORETIC ) 20-25 MG tablet Take 1 tablet by mouth daily. 90 tablet 1   loratadine (CLARITIN) 10 MG tablet Take by mouth.     metoprolol  tartrate (LOPRESSOR ) 25 MG tablet Take 1 tablet (25 mg total) by mouth 2 (two) times daily. (Patient not taking: Reported on 03/18/2024) 60 tablet 0   pantoprazole  (PROTONIX ) 40 MG tablet TAKE 1 TABLET (40 MG TOTAL) BY MOUTH TWICE A DAY BEFORE MEALS (Patient not taking: Reported on 03/18/2024) 180 tablet 1   No current facility-administered medications for this visit.    Allergies as of 04/13/2024 - Review Complete 04/13/2024  Allergen Reaction Noted  Shellfish allergy Shortness Of Breath, Itching, and Swelling 11/04/2012    Family History  Problem Relation Age of Onset   Colon polyps Father    Colon cancer Neg Hx    Rectal cancer Neg Hx    Stomach cancer Neg Hx    Esophageal cancer Neg Hx     Social History   Socioeconomic History   Marital status: Married    Spouse name: Not on file   Number of children: Not on file   Years of education: Not on file   Highest education level: Some college, no degree  Occupational History   Not on file   Tobacco Use   Smoking status: Some Days    Types: Cigars    Last attempt to quit: 12/17/2020    Years since quitting: 3.3    Passive exposure: Never   Smokeless tobacco: Never   Tobacco comments:    Smokes approx 2-3/day  Vaping Use   Vaping status: Never Used  Substance and Sexual Activity   Alcohol use: Yes    Comment: social   Drug use: No   Sexual activity: Yes  Other Topics Concern   Not on file  Social History Narrative   Not on file   Social Drivers of Health   Financial Resource Strain: Low Risk  (06/21/2023)   Overall Financial Resource Strain (CARDIA)    Difficulty of Paying Living Expenses: Not hard at all  Food Insecurity: No Food Insecurity (06/21/2023)   Hunger Vital Sign    Worried About Running Out of Food in the Last Year: Never true    Ran Out of Food in the Last Year: Never true  Transportation Needs: No Transportation Needs (06/21/2023)   PRAPARE - Administrator, Civil Service (Medical): No    Lack of Transportation (Non-Medical): No  Physical Activity: Unknown (06/21/2023)   Exercise Vital Sign    Days of Exercise per Week: 0 days    Minutes of Exercise per Session: Not on file  Stress: No Stress Concern Present (06/21/2023)   [employer] of Occupational Health - Occupational Stress Questionnaire    Feeling of Stress : Not at all  Social Connections: Unknown (06/21/2023)   Social Connection and Isolation Panel [NHANES]    Frequency of Communication with Friends and Family: More than three times a week    Frequency of Social Gatherings with Friends and Family: Once a week    Attends Religious Services: Patient declined    Database administrator or Organizations: No    Attends Engineer, structural: Not on file    Marital Status: Married  Intimate Partner Violence: Unknown (12/04/2022)   Received from [employer], [facility]   HITS    Physically Hurt: Not on file    Insult or Talk Down To: Not on file    Threaten Physical  Harm: Not on file    Scream or Curse: Not on file    Physical Exam: Vital signs in last 24 hours: BP (!) 214/136 Comment: Mark, CRNA and Dr. Rosaline Coma aware  Pulse 81   Ht 6\' 8"  (2.032 m)   Wt 243 lb (110.2 kg)   SpO2 100%   BMI 26.69 kg/m  GEN: NAD EYE: Sclerae anicteric ENT: MMM CV: Non-tachycardic Pulm: No increased work of breathing GI: Soft, NT/ND NEURO:  Alert & Oriented   Regino Caprio, MD [facility]  04/13/2024 2:01 PM

## 2024-04-13 NOTE — Op Note (Addendum)
 [facility] Patient Name: Nicholas Pearson Procedure Date: 04/13/2024 2:03 PM MRN: [id number] Endoscopist: Freada Jacobs Ruby , , [id number] Age: 53 Referring MD:  Date of Birth: 10/06/1971 Gender: Male Account #: [id number] Procedure:                Colonoscopy Indications:              Colon cancer screening in patient with 1st-degree                            relative having advanced adenoma of the colon Medicines:                Monitored Anesthesia Care Procedure:                Pre-Anesthesia Assessment:                           - Prior to the procedure, a History and Physical                            was performed, and patient medications and                            allergies were reviewed. The patient's tolerance of                            previous anesthesia was also reviewed. The risks                            and benefits of the procedure and the sedation                            options and risks were discussed with the patient.                            All questions were answered, and informed consent                            was obtained. Prior Anticoagulants: The patient has                            taken no anticoagulant or antiplatelet agents. ASA                            Grade Assessment: II - A patient with mild systemic                            disease. After reviewing the risks and benefits,                            the patient was deemed in satisfactory condition to                            undergo the procedure.  After obtaining informed consent, the colonoscope                            was passed under direct vision. Throughout the                            procedure, the patient's blood pressure, pulse, and                            oxygen saturations were monitored continuously. The                            CF [id number] #[id number] was introduced through the anus                            and  advanced to the the terminal ileum. The                            colonoscopy was performed without difficulty. The                            patient tolerated the procedure well. The quality                            of the bowel preparation was excellent. The                            terminal ileum, ileocecal valve, appendiceal                            orifice, and rectum were photographed. Scope In: 2:25:13 PM Scope Out: 2:44:42 PM Scope Withdrawal Time: 0 hours 16 minutes 16 seconds  Total Procedure Duration: 0 hours 19 minutes 29 seconds  Findings:                 The terminal ileum appeared normal.                           Four sessile polyps were found in the sigmoid colon                            and descending colon. The polyps were 3 to 6 mm in                            size. These polyps were removed with a cold snare.                            Resection and retrieval were complete.                           Multiple diverticula were found in the sigmoid                            colon and descending colon.  A 12 mm polyp was found in the rectum. The polyp                            was sessile. The polyp was removed with a hot                            snare. Resection and retrieval were complete.                           Non-bleeding internal hemorrhoids were found during                            retroflexion. Complications:            No immediate complications. Estimated Blood Loss:     Estimated blood loss was minimal. Impression:               - The examined portion of the ileum was normal.                           - Four 3 to 6 mm polyps in the sigmoid colon and in                            the descending colon, removed with a cold snare.                            Resected and retrieved.                           - Diverticulosis in the sigmoid colon and in the                            descending colon.                            - One 12 mm polyp in the rectum, removed with a hot                            snare. Resected and retrieved.                           - Non-bleeding internal hemorrhoids. Recommendation:           - Discharge patient to home (with escort).                           - Await pathology results.                           - Anusol HC cream BID for 7 days                           - The findings and recommendations were discussed  with the patient. Dr Pedro Bourgeois "Anastacio Balm" [place],  04/13/2024 2:51:56 PM

## 2024-04-13 NOTE — Patient Instructions (Addendum)
 Please read handouts provided. Await pathology results. Anusol 2.5% ream, apply rectally twice daily for 7 days.  YOU HAD AN ENDOSCOPIC PROCEDURE TODAY AT THE [facility]:   Refer to the procedure report that was given to you for any specific questions about what was found during the examination.  If the procedure report does not answer your questions, please call your gastroenterologist to clarify.  If you requested that your care partner not be given the details of your procedure findings, then the procedure report has been included in a sealed envelope for you to review at your convenience later.  YOU SHOULD EXPECT: Some feelings of bloating in the abdomen. Passage of more gas than usual.  Walking can help get rid of the air that was put into your GI tract during the procedure and reduce the bloating. If you had a lower endoscopy (such as a colonoscopy or flexible sigmoidoscopy) you may notice spotting of blood in your stool or on the toilet paper. If you underwent a bowel prep for your procedure, you may not have a normal bowel movement for a few days.  Please Note:  You might notice some irritation and congestion in your nose or some drainage.  This is from the oxygen used during your procedure.  There is no need for concern and it should clear up in a day or so.  SYMPTOMS TO REPORT IMMEDIATELY:  Following lower endoscopy (colonoscopy or flexible sigmoidoscopy):  Excessive amounts of blood in the stool  Significant tenderness or worsening of abdominal pains  Swelling of the abdomen that is new, acute  Fever of 100F or higher.  For urgent or emergent issues, a gastroenterologist can be reached at any hour by calling [phone]. Do not use MyChart messaging for urgent concerns.    DIET:  We do recommend a small meal at first, but then you may proceed to your regular diet.  Drink plenty of fluids but you should avoid alcoholic beverages for 24 hours.  ACTIVITY:  You should  plan to take it easy for the rest of today and you should NOT DRIVE or use heavy machinery until tomorrow (because of the sedation medicines used during the test).    FOLLOW UP: Our staff will call the number listed on your records the next business day following your procedure.  We will call around 7:15- 8:00 am to check on you and address any questions or concerns that you may have regarding the information given to you following your procedure. If we do not reach you, we will leave a message.     If any biopsies were taken you will be contacted by phone or by letter within the next 1-3 weeks.  Please call us  at [phone] if you have not heard about the biopsies in 3 weeks.    SIGNATURES/CONFIDENTIALITY: You and/or your care partner have signed paperwork which will be entered into your electronic medical record.  These signatures attest to the fact that that the information above on your After Visit Summary has been reviewed and is understood.  Full responsibility of the confidentiality of this discharge information lies with you and/or your care-partner.

## 2024-04-14 ENCOUNTER — Ambulatory Visit (INDEPENDENT_AMBULATORY_CARE_PROVIDER_SITE_OTHER): Admitting: Physician Assistant

## 2024-04-14 ENCOUNTER — Telehealth: Payer: Self-pay | Admitting: *Deleted

## 2024-04-14 ENCOUNTER — Encounter: Payer: Self-pay | Admitting: Physician Assistant

## 2024-04-14 VITALS — BP 190/121 | HR 76 | Ht 78.0 in | Wt 235.0 lb

## 2024-04-14 DIAGNOSIS — F17200 Nicotine dependence, unspecified, uncomplicated: Secondary | ICD-10-CM | POA: Diagnosis not present

## 2024-04-14 DIAGNOSIS — R6889 Other general symptoms and signs: Secondary | ICD-10-CM

## 2024-04-14 DIAGNOSIS — I10 Essential (primary) hypertension: Secondary | ICD-10-CM | POA: Diagnosis not present

## 2024-04-14 DIAGNOSIS — J0140 Acute pansinusitis, unspecified: Secondary | ICD-10-CM | POA: Diagnosis not present

## 2024-04-14 DIAGNOSIS — R0981 Nasal congestion: Secondary | ICD-10-CM

## 2024-04-14 DIAGNOSIS — J343 Hypertrophy of nasal turbinates: Secondary | ICD-10-CM

## 2024-04-14 MED ORDER — AMOXICILLIN-POT CLAVULANATE 875-125 MG PO TABS: 1.0000 | ORAL_TABLET | Freq: Two times a day (BID) | ORAL | 0 refills | Status: DC

## 2024-04-14 MED ORDER — FLUTICASONE PROPIONATE 50 MCG/ACT NA SUSP: 2.0000 | Freq: Every day | NASAL | 0 refills | Status: DC

## 2024-04-14 MED ORDER — METHYLPREDNISOLONE 4 MG PO TBPK: ORAL_TABLET | ORAL | 0 refills | Status: DC

## 2024-04-14 NOTE — Patient Instructions (Addendum)
 Start augmentin, flonase , and medrol  dose pack.  Follow up with Dr T for BP.   Sinus Infection, Adult A sinus infection, also called sinusitis, is inflammation of your sinuses. Sinuses are hollow spaces in the bones around your face. Your sinuses are located: Around your eyes. In the middle of your forehead. Behind your nose. In your cheekbones. Mucus normally drains out of your sinuses. When your nasal tissues become inflamed or swollen, mucus can become trapped or blocked. This allows bacteria, viruses, and fungi to grow, which leads to infection. Most infections of the sinuses are caused by a virus. A sinus infection can develop quickly. It can last for up to 4 weeks (acute) or for more than 12 weeks (chronic). A sinus infection often develops after a cold. What are the causes? This condition is caused by anything that creates swelling in the sinuses or stops mucus from draining. This includes: Allergies. Asthma. Infection from bacteria or viruses. Deformities or blockages in your nose or sinuses. Abnormal growths in the nose (nasal polyps). Pollutants, such as chemicals or irritants in the air. Infection from fungi. This is rare. What increases the risk? You are more likely to develop this condition if you: Have a weak body defense system (immune system). Do a lot of swimming or diving. Overuse nasal sprays. Smoke. What are the signs or symptoms? The main symptoms of this condition are pain and a feeling of pressure around the affected sinuses. Other symptoms include: Stuffy nose or congestion that makes it difficult to breathe through your nose. Thick yellow or greenish drainage from your nose. Tenderness, swelling, and warmth over the affected sinuses. A cough that may get worse at night. Decreased sense of smell and taste. Extra mucus that collects in the throat or the back of the nose (postnasal drip) causing a sore throat or bad breath. Tiredness (fatigue). Fever. How is  this diagnosed? This condition is diagnosed based on: Your symptoms. Your medical history. A physical exam. Tests to find out if your condition is acute or chronic. This may include: Checking your nose for nasal polyps. Viewing your sinuses using a device that has a light (endoscope). Testing for allergies or bacteria. Imaging tests, such as an MRI or CT scan. In rare cases, a bone biopsy may be done to rule out more serious types of fungal sinus disease. How is this treated? Treatment for a sinus infection depends on the cause and whether your condition is chronic or acute. If caused by a virus, your symptoms should go away on their own within 10 days. You may be given medicines to relieve symptoms. They include: Medicines that shrink swollen nasal passages (decongestants). A spray that eases inflammation of the nostrils (topical intranasal corticosteroids). Rinses that help get rid of thick mucus in your nose (nasal saline washes). Medicines that treat allergies (antihistamines). Over-the-counter pain relievers. If caused by bacteria, your health care provider may recommend waiting to see if your symptoms improve. Most bacterial infections will get better without antibiotic medicine. You may be given antibiotics if you have: A severe infection. A weak immune system. If caused by narrow nasal passages or nasal polyps, surgery may be needed. Follow these instructions at home: Medicines Take, use, or apply over-the-counter and prescription medicines only as told by your health care provider. These may include nasal sprays. If you were prescribed an antibiotic medicine, take it as told by your health care provider. Do not stop taking the antibiotic even if you start to feel better.  Hydrate and humidify  Drink enough fluid to keep your urine pale yellow. Staying hydrated will help to thin your mucus. Use a cool mist humidifier to keep the humidity level in your home above 50%. Inhale  steam for 10-15 minutes, 3-4 times a day, or as told by your health care provider. You can do this in the bathroom while a hot shower is running. Limit your exposure to cool or dry air. Rest Rest as much as possible. Sleep with your head raised (elevated). Make sure you get enough sleep each night. General instructions  Apply a warm, moist washcloth to your face 3-4 times a day or as told by your health care provider. This will help with discomfort. Use nasal saline washes as often as told by your health care provider. Wash your hands often with soap and water to reduce your exposure to germs. If soap and water are not available, use hand sanitizer. Do not smoke. Avoid being around people who are smoking (secondhand smoke). Keep all follow-up visits. This is important. Contact a health care provider if: You have a fever. Your symptoms get worse. Your symptoms do not improve within 10 days. Get help right away if: You have a severe headache. You have persistent vomiting. You have severe pain or swelling around your face or eyes. You have vision problems. You develop confusion. Your neck is stiff. You have trouble breathing. These symptoms may be an emergency. Get help right away. Call 911. Do not wait to see if the symptoms will go away. Do not drive yourself to the hospital. Summary A sinus infection is soreness and inflammation of your sinuses. Sinuses are hollow spaces in the bones around your face. This condition is caused by nasal tissues that become inflamed or swollen. The swelling traps or blocks the flow of mucus. This allows bacteria, viruses, and fungi to grow, which leads to infection. If you were prescribed an antibiotic medicine, take it as told by your health care provider. Do not stop taking the antibiotic even if you start to feel better. Keep all follow-up visits. This is important. This information is not intended to replace advice given to you by your health care  provider. Make sure you discuss any questions you have with your health care provider. Document Revised: 11/07/2021 Document Reviewed: 11/07/2021 Elsevier Patient Education  2024 [employer].

## 2024-04-14 NOTE — Telephone Encounter (Signed)
 Left message on f/u call

## 2024-04-14 NOTE — Telephone Encounter (Signed)
 Pt scheduled to come in today

## 2024-04-14 NOTE — Progress Notes (Signed)
 Established Patient Office Visit  Subjective   Patient ID: Nicholas Pearson, male    DOB: [birth year]  Age: 53 y.o. MRN: [id number]  Chief Complaint  Patient presents with   Cough    HPI Pt is a 53 yo male with HTN, nasal congestion who presents to the clinic with wife to discuss sinus pressure, nasal congestion, headache.   Pt reports to taking BP medication but unsure if he took his medications today. He denies any CP, palpitations, headaches ,vision changes. Hx of elevated BP in office on first check. Not checking BP at home.   Pt is having intermittent nasal congestion over the past month that has worsened. He is blowing out green mucus. He has a lot of sinus pressure, headache, ear pressure. He has taken allergy medication. Denies any fever, chills, body aches. Has terrible time breathing through nose.  .. Active Ambulatory Problems    Diagnosis Date Noted   Left L5 lumbar radiculopathy 11/04/2012   Hypertension 12/04/2012   LPRD (laryngopharyngeal reflux disease) 04/10/2013   Annual physical exam 05/17/2014   Hypertriglyceridemia 05/18/2014   Prediabetes 05/18/2014   Fracture of patella, right, closed 10/10/2021   Dark stools 12/25/2021   Peptic ulcer disease 12/19/2022   Cutaneous horn 06/21/2023   Current smoker 04/18/2024   Uncontrolled hypertension 04/18/2024   Essential hypertension 04/18/2024   Hypertrophy of nasal turbinates 04/18/2024   Nasal congestion 04/18/2024   Resolved Ambulatory Problems    Diagnosis Date Noted   Smoker 12/04/2012   Abscess of axilla, left 01/16/2013   Sleep apnea 01/23/2013   Tinea cruris 07/05/2016   Whiplash 02/21/2017   Postnasal drip with cough 09/19/2017   Coughing 02/13/2019   Past Medical History:  Diagnosis Date   GERD (gastroesophageal reflux disease)    Heart murmur    Patellar tendon rupture       ROS See HPI.    Objective:     BP (!) 190/121   Pulse 76   Ht 6\' 6"  (1.981 m)   Wt 235 lb (106.6 kg)    SpO2 99%   BMI 27.16 kg/m  BP Readings from Last 3 Encounters:  04/14/24 (!) 190/121  04/13/24 (!) 190/122  06/21/23 132/88   Wt Readings from Last 3 Encounters:  04/14/24 235 lb (106.6 kg)  04/13/24 243 lb (110.2 kg)  03/18/24 243 lb (110.2 kg)      Physical Exam Constitutional:      Appearance: Normal appearance.  HENT:     Head: Normocephalic.     Right Ear: Tympanic membrane, ear canal and external ear normal. There is no impacted cerumen.     Left Ear: Tympanic membrane, ear canal and external ear normal. There is no impacted cerumen.     Nose: Congestion present.     Comments: Bilateral swollen erythematous turbinates.     Mouth/Throat:     Mouth: Mucous membranes are moist.     Pharynx: Posterior oropharyngeal erythema present. No oropharyngeal exudate.  Eyes:     Conjunctiva/sclera: Conjunctivae normal.  Cardiovascular:     Rate and Nicholas: Normal rate and regular Nicholas.  Pulmonary:     Effort: Pulmonary effort is normal.     Breath sounds: Normal breath sounds.  Musculoskeletal:     Cervical back: Normal range of motion and neck supple. No tenderness.     Right lower leg: No edema.     Left lower leg: No edema.  Lymphadenopathy:     Cervical: No cervical adenopathy.  Neurological:     General: No focal deficit present.     Mental Status: He is alert and oriented to person, place, and time.  Psychiatric:        Mood and Affect: Mood normal.      The 10-year ASCVD risk score (Arnett DK, et al., 2019) is: 28.8%    Assessment & Plan:  Nicholas Pearson was seen today for cough.  Diagnoses and all orders for this visit:  Acute non-recurrent pansinusitis -     methylPREDNISolone  (MEDROL  DOSEPAK) 4 MG TBPK tablet; Take as directed by package insert. -     fluticasone  (FLONASE ) 50 MCG/ACT nasal spray; Place 2 sprays into both nostrils daily. -     amoxicillin -clavulanate (AUGMENTIN ) 875-125 MG tablet; Take 1 tablet by mouth 2 (two) times daily.  Flu-like  symptoms -     Cancel: POC COVID-19 BinaxNow -     Cancel: POCT Influenza A/B -     Cancel: POCT rapid strep A  Uncontrolled hypertension  Essential hypertension -     amLODipine  (NORVASC ) 5 MG tablet; Take 1 tablet (5 mg total) by mouth daily. -     lisinopril -hydrochlorothiazide  (ZESTORETIC ) 20-25 MG tablet; Take 1 tablet by mouth daily.  Current smoker  Nasal congestion  Hypertrophy of nasal turbinates  Other orders -     Discontinue: fluticasone  (FLONASE ) 50 MCG/ACT nasal spray; Place 2 sprays into both nostrils daily. -     Discontinue: amoxicillin -clavulanate (AUGMENTIN ) 875-125 MG tablet; Take 1 tablet by mouth 2 (two) times daily. -     Discontinue: methylPREDNISolone  (MEDROL  DOSEPAK) 4 MG TBPK tablet; Take as directed by package insert.   Treated sinusitis with augmentin , medrol  dose pack and flonase .  Medrol  dose pak and flonase  can help with nasal turbinate swelling and congestion Continue claritin If not improving consider ENT referral  Concerned about elevated BP and overall CV risk Pt has history of white coat HTN and then BP comes down with 2nd recheck Today BP did not come down  He is not checking his BP at home discussed with patient and patients wife importance of checking BP and getting this to goal under 140/90 Make sure taking BP medications sent refills Discussed importance of smoking cessation. Pt not willing to quit or make a plan at this time.  Follow up in 1 week for BP recheck  Sandy Crumb, PA-C

## 2024-04-16 LAB — SURGICAL PATHOLOGY

## 2024-04-17 ENCOUNTER — Encounter: Payer: Self-pay | Admitting: Internal Medicine

## 2024-04-18 ENCOUNTER — Encounter: Payer: Self-pay | Admitting: Physician Assistant

## 2024-04-18 DIAGNOSIS — J343 Hypertrophy of nasal turbinates: Secondary | ICD-10-CM | POA: Insufficient documentation

## 2024-04-18 DIAGNOSIS — F17200 Nicotine dependence, unspecified, uncomplicated: Secondary | ICD-10-CM | POA: Insufficient documentation

## 2024-04-18 DIAGNOSIS — I10 Essential (primary) hypertension: Secondary | ICD-10-CM | POA: Insufficient documentation

## 2024-04-18 DIAGNOSIS — R0981 Nasal congestion: Secondary | ICD-10-CM | POA: Insufficient documentation

## 2024-04-18 MED ORDER — LISINOPRIL-HYDROCHLOROTHIAZIDE 20-25 MG PO TABS: 1.0000 | ORAL_TABLET | Freq: Every day | ORAL | 0 refills | Status: DC

## 2024-04-18 MED ORDER — AMLODIPINE BESYLATE 5 MG PO TABS: 5.0000 mg | ORAL_TABLET | Freq: Every day | ORAL | 0 refills | Status: DC

## 2024-05-04 ENCOUNTER — Ambulatory Visit (INDEPENDENT_AMBULATORY_CARE_PROVIDER_SITE_OTHER): Admitting: Sports Medicine

## 2024-05-04 VITALS — BP 163/97 | HR 87 | Wt 239.0 lb

## 2024-05-04 DIAGNOSIS — Z0000 Encounter for general adult medical examination without abnormal findings: Secondary | ICD-10-CM

## 2024-05-04 DIAGNOSIS — Z23 Encounter for immunization: Secondary | ICD-10-CM

## 2024-05-04 DIAGNOSIS — I10 Essential (primary) hypertension: Secondary | ICD-10-CM | POA: Diagnosis not present

## 2024-05-04 MED ORDER — AMLODIPINE BESYLATE 10 MG PO TABS: 10.0000 mg | ORAL_TABLET | Freq: Every day | ORAL | 3 refills | Status: AC

## 2024-05-04 MED ORDER — VALSARTAN-HYDROCHLOROTHIAZIDE 320-25 MG PO TABS: 1.0000 | ORAL_TABLET | Freq: Every day | ORAL | 3 refills | Status: DC

## 2024-05-04 NOTE — Assessment & Plan Note (Signed)
 Shingrix  #1 today. Return to see me in 2 months, we can look at his blood pressure again and give him Shingrix  #2 and pneumococcal 20 at that juncture.

## 2024-05-04 NOTE — Progress Notes (Signed)
    Procedures performed today:    None.  Independent interpretation of notes and tests performed by another provider:   None.  Brief History, Exam, Impression, and Recommendations:    Benign essential hypertension This pleasant 53 year old male returns, he has difficult to control high blood pressure, currently taking amlodipine  5 and lisinopril /HCTZ 20/25. He did have a sleep study about 11 years ago that was normal. Blood pressure now uncontrolled, at home the systolics are borderline but the diastolics are always high. Switching to valsartan /HCTZ, increasing amlodipine  to 10. I would like an updated home sleep study.  Annual physical exam Shingrix  #1 today. Return to see me in 2 months, we can look at his blood pressure again and give him Shingrix  #2 and pneumococcal 20 at that juncture.    ____________________________________________ [name]. Sandy Crumb, M.D., ABFM., CAQSM., AME. Primary Care and Sports Medicine [facility]  Adjunct Professor of [facility] Medicine  [employer]  Restaurant manager, fast food

## 2024-05-04 NOTE — Assessment & Plan Note (Signed)
 This pleasant 53 year old male returns, he has difficult to control high blood pressure, currently taking amlodipine  5 and lisinopril /HCTZ 20/25. He did have a sleep study about 11 years ago that was normal. Blood pressure now uncontrolled, at home the systolics are borderline but the diastolics are always high. Switching to valsartan /HCTZ, increasing amlodipine  to 10. I would like an updated home sleep study.

## 2024-05-04 NOTE — Addendum Note (Signed)
 Addended by: Montgomery Apgar on: 05/04/2024 10:55 AM   Modules accepted: Orders

## 2024-05-29 ENCOUNTER — Telehealth: Payer: Self-pay

## 2024-05-29 NOTE — Telephone Encounter (Signed)
 [facility] called tried to transfer a call from [facility] regarding sleep study order but they had hung up before we could connect and before a return contact number was obtained.

## 2024-06-03 NOTE — Telephone Encounter (Signed)
 Spoke with [facility] representative( name is [facility])  who states that they are in need of demographic information and any other paperwork needed to be faxed to [phone]. Faxed as requested to given fax #

## 2024-06-03 NOTE — Telephone Encounter (Signed)
  [facility] - phone # [phone]

## 2024-06-04 ENCOUNTER — Ambulatory Visit: Payer: Self-pay | Admitting: Sports Medicine

## 2024-06-04 DIAGNOSIS — G4733 Obstructive sleep apnea (adult) (pediatric): Secondary | ICD-10-CM

## 2024-06-08 ENCOUNTER — Other Ambulatory Visit: Payer: Self-pay | Admitting: Physician Assistant

## 2024-06-08 DIAGNOSIS — J0140 Acute pansinusitis, unspecified: Secondary | ICD-10-CM

## 2024-06-10 ENCOUNTER — Telehealth: Payer: Self-pay

## 2024-06-10 DIAGNOSIS — G4733 Obstructive sleep apnea (adult) (pediatric): Secondary | ICD-10-CM

## 2024-06-10 NOTE — Telephone Encounter (Signed)
 Copied from CRM [id number]. Topic: Clinical - Order For Equipment >> Jun 10, 2024 10:57 AM Susanna ORN wrote: Reason for CRM: Nicholas Pearson, with [facility], called in stating that they received an order for a Cpap machine for patient. She states they are out of network with patient's insurance. Kai stated that the order needs to be sent to [facility] or somewhere else. She also stated she has cancelled the order on their end.

## 2024-06-10 NOTE — Telephone Encounter (Signed)
 Okay, to my staff please just resend the order/referral to [facility].

## 2024-06-26 MED ORDER — AMBULATORY NON FORMULARY MEDICATION: 0 refills | Status: AC

## 2024-06-26 NOTE — Telephone Encounter (Signed)
 Printed, can use my signature stamp if you get to it before me.

## 2024-06-26 NOTE — Addendum Note (Signed)
 Addended by: CURTIS DEBBY PARAS on: 06/26/2024 05:51 PM   Modules accepted: Orders

## 2024-07-06 ENCOUNTER — Ambulatory Visit: Admitting: Sports Medicine

## 2024-07-10 ENCOUNTER — Telehealth: Payer: Self-pay

## 2024-07-10 DIAGNOSIS — R109 Unspecified abdominal pain: Secondary | ICD-10-CM | POA: Insufficient documentation

## 2024-07-10 MED ORDER — METRONIDAZOLE 500 MG PO TABS: 500.0000 mg | ORAL_TABLET | Freq: Two times a day (BID) | ORAL | 0 refills | Status: AC

## 2024-07-10 MED ORDER — CIPROFLOXACIN HCL 750 MG PO TABS: 750.0000 mg | ORAL_TABLET | Freq: Two times a day (BID) | ORAL | 0 refills | Status: AC

## 2024-07-10 NOTE — Assessment & Plan Note (Signed)
 Undifferentiated though per patient has a history of diverticulitis. Adding Cipro and Flagyl, though he needs to see either myself or one of my partners ASAP.

## 2024-07-10 NOTE — Telephone Encounter (Signed)
 Cipro and Flagyl sent in, but with abdominal pain needs to see me or 1 my partners ASAP.

## 2024-07-10 NOTE — Telephone Encounter (Signed)
 Nicholas Pearson states he only has bloating and fullness. Denies abdominal pain. Advised of medications. He states he will call back if not improving after antibiotics.

## 2024-07-10 NOTE — Telephone Encounter (Signed)
 Copied from CRM [id number]. Topic: Clinical - Medication Question >> Jul 10, 2024  1:30 PM Cherylann RAMAN wrote: Reason for CRM: Patient called in with complaints of his diverticulitis flaring up. Patient is requesting Dr. ONEIDA to send him in a prescription to [facility] [phone] - [place], [place] - [address] [place] [address] Phone: [phone] Fax: [phone] Hours: Not open 24 hours

## 2024-07-27 ENCOUNTER — Ambulatory Visit (INDEPENDENT_AMBULATORY_CARE_PROVIDER_SITE_OTHER): Admitting: Sports Medicine

## 2024-07-27 ENCOUNTER — Telehealth: Payer: Self-pay | Admitting: Sports Medicine

## 2024-07-27 ENCOUNTER — Encounter: Payer: Self-pay | Admitting: Sports Medicine

## 2024-07-27 VITALS — BP 126/86 | HR 84 | Temp 97.6°F | Resp 20 | Ht >= 80 in | Wt 242.6 lb

## 2024-07-27 DIAGNOSIS — Z0000 Encounter for general adult medical examination without abnormal findings: Secondary | ICD-10-CM

## 2024-07-27 DIAGNOSIS — Z23 Encounter for immunization: Secondary | ICD-10-CM | POA: Diagnosis not present

## 2024-07-27 DIAGNOSIS — I10 Essential (primary) hypertension: Secondary | ICD-10-CM

## 2024-07-27 DIAGNOSIS — G4733 Obstructive sleep apnea (adult) (pediatric): Secondary | ICD-10-CM

## 2024-07-27 NOTE — Assessment & Plan Note (Signed)
 Blood pressure now well-controlled on valsartan /HCTZ and amlodipine  10. I suspect as we treat his sleep apnea his blood pressure medicines may be able to be peeled off to some degree.

## 2024-07-27 NOTE — Assessment & Plan Note (Signed)
 Severe OSA diagnosed, referral to adult pediatric specialists for CPAP and auto titration.

## 2024-07-27 NOTE — Assessment & Plan Note (Signed)
 Up-to-date on immunizations, Shingrix  No. 2 today, Prevnar 20 today, return as needed.

## 2024-07-27 NOTE — Progress Notes (Signed)
    Procedures performed today:    None.  Independent interpretation of notes and tests performed by another provider:   None.  Brief History, Exam, Impression, and Recommendations:    Obstructive sleep apnea Severe OSA diagnosed, referral to adult pediatric specialists for CPAP and auto titration.   Benign essential hypertension Blood pressure now well-controlled on valsartan /HCTZ and amlodipine  10. I suspect as we treat his sleep apnea his blood pressure medicines may be able to be peeled off to some degree.  Annual physical exam Up-to-date on immunizations, Shingrix  No. 2 today, Prevnar 20 today, return as needed.    ____________________________________________ Debby PARAS. Curtis, M.D., ABFM., CAQSM., AME. Primary Care and Sports Medicine  [facility]  Adjunct Professor of [facility]  [employer]  Restaurant manager, fast food

## 2024-07-27 NOTE — Addendum Note (Signed)
 Addended by: OLIVA-AVELLANEDA, Brittnee Gaetano L on: 07/27/2024 11:48 AM   Modules accepted: Orders

## 2024-07-31 ENCOUNTER — Other Ambulatory Visit: Payer: Self-pay | Admitting: Sports Medicine

## 2024-07-31 DIAGNOSIS — I10 Essential (primary) hypertension: Secondary | ICD-10-CM

## 2024-08-18 ENCOUNTER — Telehealth: Payer: Self-pay | Admitting: Sports Medicine

## 2024-08-18 ENCOUNTER — Encounter: Payer: Self-pay | Admitting: Sports Medicine

## 2024-08-18 NOTE — Telephone Encounter (Signed)
 Copied from CRM [id number]. Topic: General - Other >> Aug 14, 2024  4:33 PM Mercer PEDLAR wrote: Reason for CRM:  Patient calling regarding order for cpap machine. He stated that it was sent to [facility] which is out of network for insurance and needs it sent to somewhere withen network. Platient would like callback with status update.

## 2024-08-20 ENCOUNTER — Telehealth: Payer: Self-pay

## 2024-08-20 DIAGNOSIS — G4733 Obstructive sleep apnea (adult) (pediatric): Secondary | ICD-10-CM

## 2024-08-20 NOTE — Telephone Encounter (Signed)
 Copied from CRM [id number]. Topic: Clinical - Medical Advice >> Aug 20, 2024 12:20 PM Laurier C wrote: Reason for CRM: Patient's spouse Catering manager) is requesting a call back at [phone]. They have been calling regarding a CPA machine since June and no one has given them any updates. The initial request for the machine was sent to an [facility] provider and patient requested it go to [facility] because his insurance would cover the cost. 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

## 2024-08-20 NOTE — Telephone Encounter (Signed)
 Previous note in chart   AM   08/18/24  8:38 AM Note Copied from CRM #[id number]. Topic: General - Other >> Aug 14, 2024  4:33 PM Mercer PEDLAR wrote: Reason for CRM:  Patient calling regarding order for cpap machine. He stated that it was sent to [facility] which is out of network for insurance and needs it sent to somewhere withen network. Platient would like callback with status update.     Forwarding message to [facility] covering for Dr. Curtis.   I think CPAP will need to be reordered under whitney crain name before sending ?

## 2024-08-20 NOTE — Telephone Encounter (Signed)
 Attempted to contact patient but unfortunately was sent to voicemail. I have left a voice message requesting a call back to our office.

## 2024-08-21 ENCOUNTER — Telehealth: Payer: Self-pay | Admitting: Urgent Care

## 2024-08-21 ENCOUNTER — Telehealth: Payer: Self-pay | Admitting: Sports Medicine

## 2024-08-21 NOTE — Telephone Encounter (Signed)
 Copied from CRM [id number]. Topic: Clinical - Medical Advice >> Aug 20, 2024 12:20 PM Laurier C wrote: Reason for CRM: Patient's spouse Catering manager) is requesting a call back at [phone]. They have been calling regarding a CPA machine since June and no one has given them any updates. The initial request for the machine was sent to an [facility] provider and patient requested it go to [facility] because his insurance would cover the cost. 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

## 2024-08-21 NOTE — Telephone Encounter (Signed)
 Copied from CRM [id number]. Topic: Clinical - Medical Advice >> Aug 20, 2024 12:20 PM Laurier C wrote: Reason for CRM: Patient's spouse Catering manager) is requesting a call back at [phone]. They have been calling regarding a CPA machine since June and no one has given them any updates. The initial request for the machine was sent to an [facility] provider and patient requested it go to [facility] because his insurance would cover the cost. 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 >> Aug 20, 2024  4:30 PM Alfonso ORN wrote: Per Ricardo at [facility] stated the information was sent over to [employer] around 2:38 pm today 08/20/24 , waiting for whitney Crain to response to this matter   , patient has been diagnois with severe sleep apnea  Spouse Amber is very upset due to this never been taking care of  >> Aug 20, 2024  4:22 PM Alfonso ORN wrote: Patient spouse Masayuki Sakai calling regarding patient been requesting a cpap machine since June and never got any response

## 2024-08-21 NOTE — Telephone Encounter (Signed)
 Pt returning Karla's phone call

## 2024-08-24 NOTE — Telephone Encounter (Signed)
 I reviewed this case last week and sent pt a message - Dr. ONEIDA placed a referral to sleep medicine to find him the correct supplies. He did not want to order the CPAP machine himself since it was a rather severe case. Sleep medicine needs to titrate his machine

## 2024-08-24 NOTE — Telephone Encounter (Signed)
 I called his wife back. She is very upset and wants this taken care of ASAP. I called the number for the referral and had to leave a message.

## 2024-08-25 ENCOUNTER — Telehealth: Payer: Self-pay

## 2024-08-25 NOTE — Telephone Encounter (Signed)
 Copied from CRM #[id number]. Topic: General - Other >> Aug 24, 2024  4:08 PM Susanna ORN wrote: Reason for CRM: Patient called to speak with Arnulfo or Jon. States he's been trying to speak with either of them for a while now. Please give patient a call back. CB #: [phone]

## 2024-08-25 NOTE — Telephone Encounter (Signed)
 Patient is returning Ms. Tosha's call. [facility] stated that she is working at [employer] today, I contacted [employer] and they stated that she was only there to establish someone but has already left. Please call patient back.

## 2024-08-25 NOTE — Telephone Encounter (Signed)
 Contacted [facility]), [name] states patient was contacted yesterday regarding in lab CPAP titration sleep study. First available appointment is 09/2024 but patient can be placed on waitlist.  Dr. ONEIDA initially wrote orders for AutoPap but there were some concerns regarding patients insurance coverage and patient has severe sleep apnea.   Left message on patients voicemail updating patient on sleep study status.

## 2024-08-25 NOTE — Telephone Encounter (Signed)
 I called and spoke with patient. He is aware the sleep study center is now working on the prior authorization and they will call to schedule as soon as the prior authorization is approved.

## 2024-08-25 NOTE — Telephone Encounter (Signed)
 See telephone note dated 08/21/24.

## 2024-08-28 ENCOUNTER — Ambulatory Visit (HOSPITAL_BASED_OUTPATIENT_CLINIC_OR_DEPARTMENT_OTHER): Attending: Urgent Care | Admitting: Internal Medicine

## 2024-08-28 DIAGNOSIS — G4733 Obstructive sleep apnea (adult) (pediatric): Secondary | ICD-10-CM | POA: Diagnosis present

## 2024-09-06 DIAGNOSIS — G4733 Obstructive sleep apnea (adult) (pediatric): Secondary | ICD-10-CM

## 2024-09-06 NOTE — Procedures (Signed)
 [facility] [address] [place], [place] [address] Tel: [phone]   Fax: [phone]  Titration Interpretation  Patient Name:  Nicholas Pearson, Nicholas Pearson Study Date:  08/28/2024 Referring Physician:  BENTON GAVE [phone]) %%startinterp%% Indications for Polysomnography The patient is a 53 year-old Male who is 6' 8 and weighs 242.0 lbs. His BMI equals 26.6.  A full night titration treatment study was performed. Baseline HST 06/02/24- AHI(3%) 49.1/hr, desaturation to 78%, body weight 239 lbs.  Medication  No Data.   Polysomnogram Data A full night polysomnogram recorded the standard physiologic parameters including EEG, EOG, EMG, EKG, nasal and oral airflow.  Respiratory parameters of chest and abdominal movements were recorded with Respiratory Inductance Plethysmography belts.  Oxygen saturation was recorded by pulse oximetry.   Sleep Architecture The total recording time of the polysomnogram was 422.8 minutes.  The total sleep time was 302.0 minutes.  The patient spent 2.5% of total sleep time in Stage N1, 59.3% in Stage N2, 3.3% in Stages N3, and 34.9% in REM.  Sleep latency was 13.4 minutes.  REM latency was 60.0 minutes.  Sleep Efficiency was 71.4%.  Wake after Sleep Onset time was 107.0 minutes.  Titration Summary The patient was titrated at pressures ranging from 5* cm/H20 with supplemental oxygen at - up to 13* cm/H20 with supplemental oxygen at -.  The last pressure used in the study was 13* cm/H20 with supplemental oxygen at -.  Respiratory Events The polysomnogram revealed a presence of - obstructive, - central, and - mixed apneas resulting in an Apnea index of 0.4 events per hour.  There were - hypopneas (>=3% desaturation and/or arousal) resulting in an Apnea\Hypopnea Index (AHI >=3% desaturation and/or arousal) of 0.4 events per hour.  There were - hypopneas (>=4% desaturation) resulting in an Apnea\Hypopnea Index (AHI >=4% desaturation)  of 0.4 events per hour.  There were 1 Respiratory Effort Related Arousals resulting in a RERA index of 0.2 events per hour. The Respiratory Disturbance Index is 0.6 events per hour.  The snore index was 18.5 events per hour.  Mean oxygen saturation was 98.5%.  The lowest oxygen saturation during sleep was 93.0%.  Time spent <=88% oxygen saturation was - minutes (-).  Limb Activity There were 14 limb movements recorded.  Of this total, 4 were classified as PLMs.  Of the PLMs, - were associated with arousals.  The Limb Movement index was 2.8 per hour while the PLM index was 0.8 per hour.  Cardiac Summary The average pulse rate was 64.3 bpm.  The minimum pulse rate was 53.0 bpm while the maximum pulse rate was 84.0 bpm.  Cardiac rhythm was normal.  Comments: CPAP titration to 13 cwp with residual AHI(3%) 0.4/hr., saturation nadir 95%, mean 98.6%.  Diagnosis: Obstructive sleep apnea  Recommendations: Suggest autopap 8-18 or fixed CPAP 13. Patient used a large F&&P Simplus full-face mask with heated humidity.   This study was personally reviewed and electronically signed by: Neysa Rama  MD Accredited Board Certified in Sleep Medicine Date/Time: 09/06/24  12:23    %%endinterp%%  Titration Report  Patient Name: Nicholas Pearson, Nicholas Pearson Study Date: 08/28/2024  Date of Birth: 03-09-1971 Study Type: CPAP Titration  Age: 53 year MRN #: [id number]  Sex: Male Interpreting Physician: NEYSA RAMA, [id number]  Height: 6' 8 Referring Physician: BENTON GAVE [phone])  Weight: 242.0 lbs Recording Tech: Jamee McConnico RPSGT RST  BMI: 26.6 Scoring Tech: Yvonne McConnico RPSGT RST  ESS: 4 Neck Size: 18  Mask  Type Fisher-Paykel Simplus FF mask Final Pressure: 13  Mask Size: Large Supplemental O2: -   Study Overview  Lights Off: 09:41:08 PM  Count Index  Lights On: 04:43:54 AM Awakenings: 27 5.4  Time in Bed: 422.8 min. Arousals: 3 0.6  Total Sleep Time: 302.0 min. AHI (>=3% Desat and/or Ar.): 2 0.4    Sleep Efficiency: 71.4% AHI (>=4% Desat): 2 0.4   Sleep Latency: 13.4 min. Limb Movements: 14 2.8  Wake After Sleep Onset: 107.0 min. Snore: 93 18.5  REM Latency from Sleep Onset: 60.0 min. Desaturations: 2 0.4     Minimum SpO2 TST: 93.0%    Sleep Architecture  % of Time in Bed Stages Time (mins) % Sleep Time  Wake 120.5   Stage N1 7.5 2.5%  Stage N2 179.0 59.3%  Stage N3 10.0 3.3%  REM 105.5 34.9%   Arousal Summary   NREM REM Sleep Index  Respiratory Arousals - - - -  PLM Arousals - - - -  Isolated Limb Movement Arousals - - - -  Snore Arousals - - - -  Spontaneous Arousals 1 2 3  0.6  Total 1 2 3  0.6   Limb Movement Summary   Count Index  Isolated Limb Movements 10 2.0  Periodic Limb Movements (PLMs) 4 0.8  Total Limb Movements 14 2.8    Respiratory Summary   By Sleep Stage By Body Position Total   NREM REM Supine Non-Supine   Time (min) 196.5 105.5 273.0 29.0 302.0         Obstructive Apnea - - - - -  Mixed Apnea - - - - -  Central Apnea - - - - -  Total Apneas 2 - 1 1 2   Total Apnea Index 0.6 - 0.2 2.1 0.4         Hypopneas (>=3% Desat and/or Ar.) - - - - -  AHI (>=3% Desat and/or Ar.) 0.6 - 0.2 2.1 0.4         Hypopneas (>=4% Desat) - - - - -  AHI (>=4% Desat) 0.6 - 0.2 2.1 0.4          RERAs 1 - 1 - 1  RERA Index 0.3 - 0.2 - 0.2         RDI 0.9 - 0.4 2.1 0.6     Respiratory Event Durations   Apnea Hypopnea   NREM REM NREM REM  Average (seconds) 17.5 - - -  Maximum (seconds) 18.3 - - -    Oxygen Saturation Summary   Wake NREM REM TST TIB  Average SpO2 98.6% 98.3% 98.8% 98.5% 98.5%  Minimum SpO2 94.0% 93.0% 97.0% 93.0% 93.0%  Maximum SpO2 100.0% 100.0% 100.0% 100.0% 100.0%   Oxygen Saturation Distribution  Range (%) Time in range (min) Time in range (%)  90.0 - 100.0 422.3 100.0%  80.0 - 90.0 - -  70.0 - 80.0 - -  60.0 - 70.0 - -  50.0 - 60.0 - -  0.0 - 50.0 - -  Time Spent <=88% SpO2  Range (%) Time in range (min) Time in range  (%)  0.0 - 88.0 - -      Count Index  Desaturations 2 0.4    Cardiac Summary   Wake NREM REM Sleep Total  Average Pulse Rate (BPM) 64.2 65.1 63.1 64.4 64.3  Minimum Pulse Rate (BPM) 53.0 56.0 56.0 56.0 53.0  Maximum Pulse Rate (BPM) 84.0 77.0 77.0 77.0 84.0   Pulse Rate Distribution:  Range (bpm)  Time in range (min) Time in range (%)  0.0 - 40.0 - -  40.0 - 60.0 66.3 15.7%  60.0 - 80.0 356.3 84.3%  80.0 - 100.0 0.2 0.1%  100.0 - 120.0 - -  120.0 - 140.0 - -  140.0 - 200.0 - -   Titration Summary  PAP Device PAP Level O2 Level Time (min) Wake (min) NREM (min) REM (min) Sleep Eff% OA# CA# MA# Hyp# (>=3%) AHI (>=3%) Hyp# (>=4%) AHI (>=%4) RERA RDI SpO2 <=88% (min) Min SpO2 Mean SpO2 Ar. Index  - Off - 2.5 2.5 0.0 0.0 0.0%               CPAP 5 - 33.5 21.0 12.5 0.0 37.3% - - - - 4.8 -  4.8 1  9.6  0.0 93.0 98.0 -  CPAP 6 - 14.0 5.5 8.5 0.0 60.7%               CPAP 7 - 2.5 0.0 2.5 0.0 100.0% - - - - - -  - -  -  0.0 98.0 98.1 -  CPAP 8 - 9.5 0.0 9.5 0.0 100.0% - - - - - -  - -  -  0.0 98.0 98.3 -  CPAP 10 - 76.5 11.0 40.5 25.0 85.6% - - - - - -  - -  -  0.0 98.0 98.3 -  CPAP 12 - 37.0 0.0 31.0 6.0 100.0% - - - - - -  - -  -  0.0 98.0 98.9 -  CPAP 13 - 247.5 80.5 92.0 74.5 67.3% - - - - 0.4 -  0.4 -  0.4  0.0 95.0 98.6 1.1    Hypnograms                           Technologist Comments  Pt presented to the lab for a CPAP titration.  He had had a home study previously . 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 cancelled and he could come in for his titration tonight.  He is a very pleasant man with a great attitude so far about his needing CPAP.  I fit him with a Fisher-Paykel Simplus FF Large and it wa perfect fit.  He accepted the mask and the air well.  He went to sleep easily and snored loudly immediately.  I raised to 10 slowly for snores and they did stop at 10 . This was in the supine position. He normally sleeps supine or on his left  side.  I asked him to get more on his side to sleep and he had a difficult time getting back to sleep . Then he tried the other side and he's on his back again.  He is going into rem and sleeping fine again on his back.  He did have sleep onset events while trying to sleep on his sides. I have not observed any cardiac abnormalities nor PLMs .  He did have a few leg kicks but certainly not many. PT did not take any meds while in the lab. Pt had quite a few rem periods and looked good.  I raised mostly for restriction and snores to 13.  He did need that for the loud snores.                           Reggy Salt Diplomate, Biomedical engineer of Sleep Medicine  ELECTRONICALLY SIGNED ON:  09/06/2024, 12:16 PM [facility] PH: [phone]   FX: [phone] ACCREDITED BY THE AMERICAN ACADEMY OF SLEEP MEDICINE

## 2024-09-06 NOTE — Procedures (Signed)
  Indications for Polysomnography The patient is a 53 year-old Male who is 6' 8 and weighs 242.0 lbs. His BMI equals 26.6.  A full night titration treatment study was performed.  MedicationNo Data. Polysomnogram Data A full night polysomnogram recorded the standard physiologic parameters including EEG, EOG, EMG, EKG, nasal and oral airflow.  Respiratory parameters of chest and abdominal movements were recorded with Respiratory Inductance Plethysmography belts.   Oxygen saturation was recorded by pulse oximetry.  Sleep Architecture The total recording time of the polysomnogram was 422.8 minutes.  The total sleep time was 302.0 minutes.  The patient spent 2.5% of total sleep time in Stage N1, 59.3% in Stage N2, 3.3% in Stages N3, and 34.9% in REM.  Sleep latency was 13.4 minutes.   REM latency was 60.0 minutes.  Sleep Efficiency was 71.4%.  Wake after Sleep Onset time was 107.0 minutes.  Titration Summary The patient was titrated at pressures ranging from 5* cm/H20 with supplemental oxygen at - up to 13* cm/H20 with supplemental oxygen at -.  The last pressure used in the study was 13* cm/H20 with supplemental oxygen at -.  Respiratory Events The polysomnogram revealed a presence of - obstructive, - central, and - mixed apneas resulting in an Apnea index of 0.4 events per hour.  There were - hypopneas (GreaterEqual to3% desaturation and/or arousal) resulting in an Apnea\Hypopnea Index (AHI  GreaterEqual to3% desaturation and/or arousal) of 0.4 events per hour.  There were - hypopneas (GreaterEqual to4% desaturation) resulting in an Apnea\Hypopnea Index (AHI GreaterEqual to4% desaturation) of 0.4 events per hour.  There were 1 Respiratory  Effort Related Arousals resulting in a RERA index of 0.2 events per hour. The Respiratory Disturbance Index is 0.6 events per hour.  The snore index was 18.5 events per hour.  Mean oxygen saturation was 98.5%.  The lowest oxygen saturation during sleep was 93.0%.   Time spent LessEqual to88% oxygen saturation was  minutes ().  Limb Activity There were 14 limb movements recorded.  Of this total, 4 were classified as PLMs.  Of the PLMs, - were associated with arousals.  The Limb Movement index was 2.8 per hour while the PLM index was 0.8 per hour.  Cardiac Summary The average pulse rate was 64.3 bpm.  The minimum pulse rate was 53.0 bpm while the maximum pulse rate was 84.0 bpm.  Cardiac rhythm was normal/abnormal.  Comments:  Diagnosis:  Recommendations:   This study was personally reviewed and electronically signed by: Neysa Rama  MD Accredited Board Certified in Sleep Medicine Date/Time:

## 2024-09-18 MED ORDER — AMBULATORY NON FORMULARY MEDICATION: 0 refills | Status: AC

## 2024-09-18 NOTE — Addendum Note (Signed)
 Addended by: Asuncion Tapscott on: 09/18/2024 10:37 AM   Modules accepted: Orders

## 2024-09-18 NOTE — Telephone Encounter (Signed)
 Sleep study results, CPAP titration results, clinical notes, demographics and copies of insurance cards have been faxed to [facility] at [phone].  Pt aware that [facility] will be working on his orders and will contact him as soon as possible.

## 2024-09-21 ENCOUNTER — Telehealth: Payer: Self-pay

## 2024-09-21 NOTE — Telephone Encounter (Signed)
 Copied from CRM [id number]. Topic: General - Other >> Sep 18, 2024  4:17 PM Fonda T wrote: Reason for CRM: Received call from [facility] with [facility], [phone].  Calling to advise order for CPA was received from provider, however, this company is not in network with patient's insurance.  Can be reached at [phone] if need to speak further.

## 2024-09-21 NOTE — Telephone Encounter (Signed)
 Nicholas Pearson, would you be able to assist with this ?

## 2024-09-21 NOTE — Telephone Encounter (Signed)
 Sleep study orders, clinical notes, demographics, and copies of insurance cards have been faxed to [facility] at [phone]. Office will contact patient to complete online registration in order to ship home sleep equipment.

## 2024-11-16 ENCOUNTER — Ambulatory Visit

## 2024-11-16 ENCOUNTER — Other Ambulatory Visit: Payer: Self-pay

## 2024-11-16 ENCOUNTER — Ambulatory Visit
Admission: EM | Admit: 2024-11-16 | Discharge: 2024-11-16 | Disposition: A | Discharge status: Home / Self Care | Attending: Family Medicine | Admitting: Family Medicine

## 2024-11-16 DIAGNOSIS — R1030 Lower abdominal pain, unspecified: Secondary | ICD-10-CM

## 2024-11-16 DIAGNOSIS — R141 Gas pain: Secondary | ICD-10-CM | POA: Diagnosis not present

## 2024-11-16 DIAGNOSIS — R140 Abdominal distension (gaseous): Secondary | ICD-10-CM | POA: Diagnosis not present

## 2024-11-16 NOTE — Discharge Instructions (Signed)
 Stop at a pharmacy on the way home and get some simethicone To stay on clear liquids and broth through the evening If you get really hungry might have some soup or light food If you get worse instead of better at any time you must go to the emergency room You can call your GI doctor in the morning to see if they can see you this week.  Let me know if you need a referral

## 2024-11-16 NOTE — ED Provider Notes (Signed)
 Nicholas Pearson    CSN: [id number] Arrival date & time: 11/16/24  1507      History   Chief Complaint Chief Complaint  Patient presents with   Blood In Stools    HPI Nicholas Pearson is a 53 y.o. male.   Patient has had a couple days of feeling abdominal bloating.  Increased abdominal sounds.  He thinks his bowels have been normal.  He states that his bowels are usually regular.  He also has had blood in his stool in the last 3 days.  No bleeding otherwise.  No mucus in the stool.  He thought he had a fever a couple days ago.  He does not now.  Appetite is good. Patient has had a history of peptic ulcer disease and GI bleeds.  He has a prescription for Anusol  HC but has not been using it He also had a colonoscopy performed 6 months ago which revealed numerous polyps, diverticulosis, and internal hemorrhoids.  He has never had diverticulitis.    Past Medical History:  Diagnosis Date   GERD (gastroesophageal reflux disease)    Heart murmur    As a teenager   Hypertension    Patellar tendon rupture    right    Patient Active Problem List   Diagnosis Date Noted   Abdominal pain 07/10/2024   Current smoker 04/18/2024   Uncontrolled hypertension 04/18/2024   Hypertrophy of nasal turbinates 04/18/2024   Nasal congestion 04/18/2024   Cutaneous horn 06/21/2023   Peptic ulcer disease 12/19/2022   Dark stools 12/25/2021   Fracture of patella, right, closed 10/10/2021   Hypertriglyceridemia 05/18/2014   Prediabetes 05/18/2014   Annual physical exam 05/17/2014   LPRD (laryngopharyngeal reflux disease) 04/10/2013   Obstructive sleep apnea 01/23/2013   Benign essential hypertension 12/04/2012   Left L5 lumbar radiculopathy 11/04/2012    Past Surgical History:  Procedure Laterality Date   KNEE SURGERY     left   PATELLAR TENDON REPAIR Right 10/17/2021   Procedure: RIGHT PATELLA TENDON REPAIR;  Surgeon: Josefina Chew, MD;  Location: [facility];   Service: Orthopedics;  Laterality: Right;   WISDOM TOOTH EXTRACTION         Home Medications    Prior to Admission medications   Medication Sig Start Date End Date Taking? Authorizing Provider  AMBULATORY NON FORMULARY MEDICATION Continuous positive airway pressure (CPAP) machine set on AutoPAP (4-20 cmH2O), with all supplemental supplies as needed. 06/26/24   Curtis Debby PARAS, MD  AMBULATORY NON FORMULARY MEDICATION Suggest autopap 8-18 or fixed CPAP 13. Patient used a large F&&P Simplus full-face mask with heated humidity. 09/18/24   Crain, Whitney L, PA  amLODipine  (NORVASC ) 10 MG tablet Take 1 tablet (10 mg total) by mouth daily. 05/04/24   Curtis Debby PARAS, MD  fluticasone  (FLONASE ) 50 MCG/ACT nasal spray SPRAY 2 SPRAYS INTO EACH NOSTRIL EVERY DAY 06/08/24   Breeback, Jade L, PA-C  hydrocortisone  (ANUSOL -HC) 2.5 % rectal cream Place 1 Application rectally 2 (two) times daily. Apply rectal cream twice daily for 7 days. 04/13/24   Federico Rosario BROCKS, MD  loratadine (CLARITIN) 10 MG tablet Take by mouth.    [provider]  valsartan -hydrochlorothiazide  (DIOVAN -HCT) 320-25 MG tablet TAKE 1 TABLET BY MOUTH EVERY DAY 07/31/24   Curtis Debby PARAS, MD    Family History Family History  Problem Relation Age of Onset   Colon polyps Father    Colon cancer Neg Hx    Rectal cancer Neg Hx  Stomach cancer Neg Hx    Esophageal cancer Neg Hx     Social History Social History   Tobacco Use   Smoking status: Some Days    Types: Cigars    Last attempt to quit: 12/17/2020    Years since quitting: 3.9    Passive exposure: Never   Smokeless tobacco: Never   Tobacco comments:    Smokes approx 2-3/day  Vaping Use   Vaping status: Never Used  Substance Use Topics   Alcohol use: Yes    Comment: social   Drug use: No     Allergies   Shellfish allergy   Review of Systems Review of Systems See HPI  Physical Exam Triage Vital Signs ED Triage Vitals  Encounter  Vitals Group     BP 11/16/24 1526 (!) 136/92     Girls Systolic BP Percentile --      Girls Diastolic BP Percentile --      Boys Systolic BP Percentile --      Boys Diastolic BP Percentile --      Pulse Rate 11/16/24 1526 76     Resp 11/16/24 1526 18     Temp 11/16/24 1526 98 F (36.7 C)     Temp Source 11/16/24 1526 Oral     SpO2 11/16/24 1526 98 %     Weight 11/16/24 1529 245 lb (111.1 kg)     Height 11/16/24 1529 6' 8 (2.032 m)     Head Circumference --      Peak Flow --      Pain Score 11/16/24 1528 0     Pain Loc --      Pain Education --      Exclude from Growth Chart --    No data found.  Updated Vital Signs BP (!) 136/92 (BP Location: Right Arm)   Pulse 76   Temp 98 F (36.7 C) (Oral)   Resp 18   Ht 6' 8 (2.032 m)   Wt 111.1 kg   SpO2 98%   BMI 26.91 kg/m       Physical Exam Constitutional:      General: He is not in acute distress.    Appearance: He is well-developed.  HENT:     Head: Normocephalic and atraumatic.  Eyes:     Conjunctiva/sclera: Conjunctivae normal.     Pupils: Pupils are equal, round, and reactive to light.  Cardiovascular:     Rate and Rhythm: Normal rate and regular rhythm.  Pulmonary:     Effort: Pulmonary effort is normal. No respiratory distress.     Breath sounds: Normal breath sounds.  Abdominal:     General: Abdomen is flat. There is no distension.     Palpations: Abdomen is soft.     Comments: Active bowel sounds.  Tenderness to deep palpation of the right lower quadrant.  No guarding or rebound.  No mass palpable.  No organomegaly  Musculoskeletal:        General: Normal range of motion.     Cervical back: Normal range of motion.  Skin:    General: Skin is warm and dry.  Neurological:     Mental Status: He is alert.      UC Treatments / Results  Labs (all labs ordered are listed, but only abnormal results are displayed) Labs Reviewed - No data to display  EKG   Radiology DG Abdomen 1 View Result Date:  11/16/2024 CLINICAL DATA:  Abdominal discomfort EXAM: ABDOMEN - 1 VIEW COMPARISON:  None Available. FINDINGS: There are few dilated small bowel loops in the left abdomen measuring up to 4.4 cm. Otherwise no dilated bowel loops are seen. Air seen throughout nondilated colon to the level the rectum. Stool burden is average. There are no suspicious calcifications. Lung bases are clear. No acute fractures are seen. IMPRESSION: Few dilated small bowel loops in the left abdomen measuring up to 4.4 cm. Findings may represent ileus or early/partial small bowel obstruction. Electronically Signed   By: Greig Pique M.D.   On: 11/16/2024 16:49    Procedures Procedures (including critical Pearson time)  Medications Ordered in UC Medications - No data to display  Initial Impression / Assessment and Plan / UC Course  I have reviewed the triage vital signs and the nursing notes.  Pertinent labs & imaging results that were available during my Pearson of the patient were reviewed by me and considered in my medical decision making (see chart for details).     Patient has eaten very little today, only a banana.  He is keeping fluids down well.  He has a decreased appetite but no nausea.  He had a normal bowel movement this morning.  States he has regular bowel habits.  He is very uncomfortable with abdominal bloating and increased gas.  Does not describe it as pain.  I reviewed his x-ray with him.  I told him that if we suspect small bowel obstruction he needs to go to the emergency room.  Clinically, he does not appear this sick.  Chooses to go home on clear liquids with a promise to go to the ER if he gets worse instead of better at any time. Final Clinical Impressions(s) / UC Diagnoses   Final diagnoses:  Lower abdominal pain  Bloating symptom  Abdominal gas pain     Discharge Instructions      Stop at a pharmacy on the way home and get some simethicone To stay on clear liquids and broth through the  evening If you get really hungry might have some soup or light food If you get worse instead of better at any time you must go to the emergency room You can call your GI doctor in the morning to see if they can see you this week.  Let me know if you need a referral     ED Prescriptions   None    PDMP not reviewed this encounter.   Maranda Jamee Jacob, MD 11/16/24 [phone]

## 2024-11-16 NOTE — ED Triage Notes (Signed)
 Pt presented with c/o bright red blood in his stools x 3 days. Pt c/o  increasing gas pain. Pt stated that he took  Gas x yesterday with minimal effectiveness.

## 2024-11-17 ENCOUNTER — Telehealth: Payer: Self-pay

## 2024-11-17 NOTE — Telephone Encounter (Signed)
 [facility] if any questions or concerns about yesterdays visit.

## 2024-12-22 ENCOUNTER — Ambulatory Visit: Admitting: Gastroenterology

## 2024-12-22 NOTE — Progress Notes (Deleted)
 SABRA

## 2025-01-06 ENCOUNTER — Other Ambulatory Visit: Payer: Self-pay | Admitting: Physician Assistant

## 2025-01-06 DIAGNOSIS — J0140 Acute pansinusitis, unspecified: Secondary | ICD-10-CM
